# Patient Record
Sex: Female | Born: 1985
Health system: Southern US, Community
[De-identification: ages and names within clinical notes are randomized; demographics above are authoritative.]

## PROBLEM LIST (undated history)

## (undated) DIAGNOSIS — D649 Anemia, unspecified: Secondary | ICD-10-CM

## (undated) DIAGNOSIS — T7840XA Allergy, unspecified, initial encounter: Secondary | ICD-10-CM

## (undated) DIAGNOSIS — F419 Anxiety disorder, unspecified: Secondary | ICD-10-CM

## (undated) DIAGNOSIS — R519 Headache, unspecified: Secondary | ICD-10-CM

## (undated) DIAGNOSIS — T753XXA Motion sickness, initial encounter: Secondary | ICD-10-CM

## (undated) DIAGNOSIS — F32A Depression, unspecified: Secondary | ICD-10-CM

## (undated) HISTORY — DX: Anxiety disorder, unspecified: F41.9

## (undated) HISTORY — DX: Depression, unspecified: F32.A

## (undated) HISTORY — DX: Anemia, unspecified: D64.9

## (undated) HISTORY — DX: Allergy, unspecified, initial encounter: T78.40XA

---

## 2005-09-24 ENCOUNTER — Observation Stay: Payer: Self-pay

## 2005-09-24 ENCOUNTER — Other Ambulatory Visit: Payer: Self-pay

## 2005-12-03 ENCOUNTER — Observation Stay: Payer: Self-pay | Admitting: Obstetrics and Gynecology

## 2005-12-13 ENCOUNTER — Observation Stay: Payer: Self-pay | Admitting: Obstetrics and Gynecology

## 2005-12-13 ENCOUNTER — Inpatient Hospital Stay: Payer: Self-pay

## 2009-08-09 ENCOUNTER — Inpatient Hospital Stay: Payer: Self-pay

## 2010-03-29 ENCOUNTER — Ambulatory Visit: Payer: Self-pay | Admitting: Obstetrics and Gynecology

## 2010-04-05 ENCOUNTER — Ambulatory Visit: Payer: Self-pay | Admitting: Obstetrics and Gynecology

## 2010-04-13 ENCOUNTER — Ambulatory Visit: Payer: Self-pay | Admitting: Obstetrics and Gynecology

## 2010-04-15 HISTORY — PX: LAPAROSCOPY: SHX197

## 2012-01-10 IMAGING — CR PELVIS - 1-2 VIEW
1 series · 1 of 1 positions shown · non-contrast
Comparison: none

REASON FOR EXAM: plain films requested. unable to locate IUD on US
COMMENTS:

PROCEDURE:     DXR - DXR PELVIS AP ONLY  - March 29, 2010 [DATE]
RESULT:     A T-shaped intrauterine device projects within the upper left
paracentral portion of the pelvis at the level of the lower to mid sacrum.
The osseous structures demonstrate no evidence of fracture or dislocation.

[view not recorded]
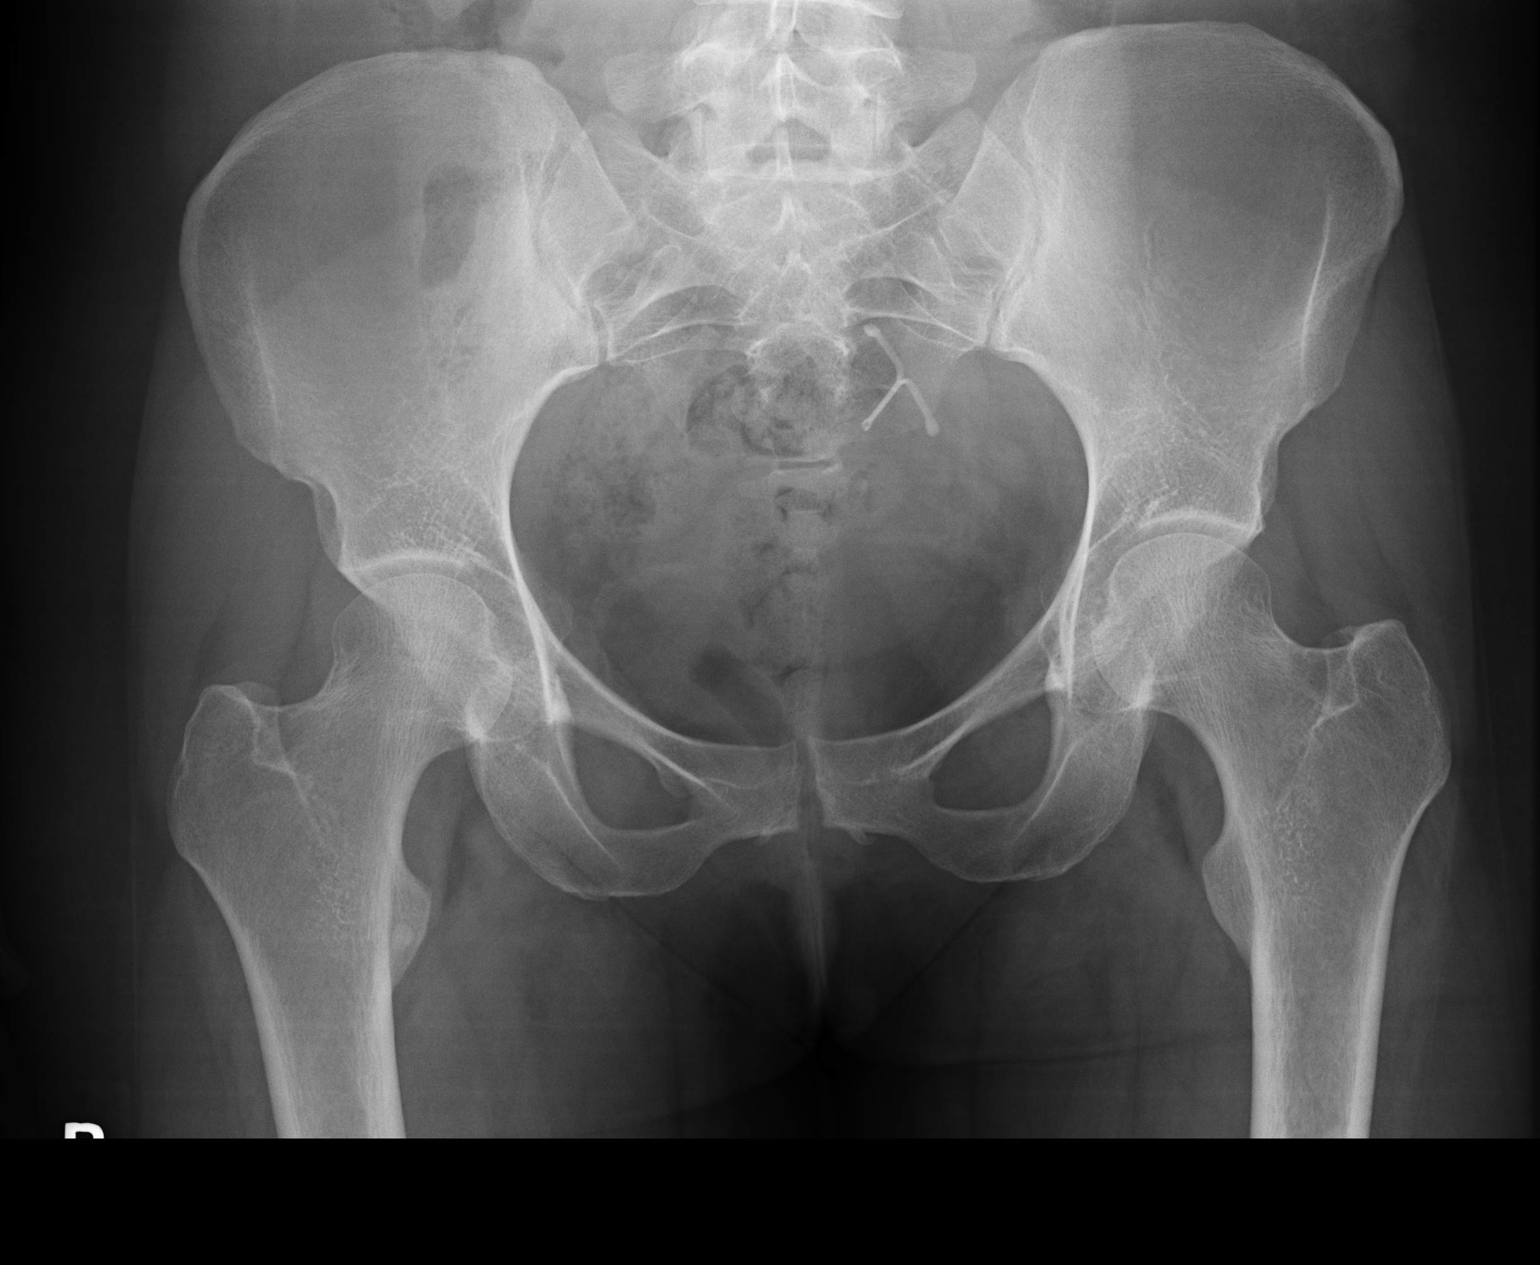

[1 of 1 positions shown; findings below may reference images not displayed]

IMPRESSION: T-shaped intrauterine device projecting at the level of the mid to lower
sacrum on the left as described above.

## 2012-09-21 ENCOUNTER — Inpatient Hospital Stay: Payer: Self-pay

## 2012-09-21 LAB — CBC WITH DIFFERENTIAL/PLATELET
Basophil %: 0.3 %
Eosinophil #: 0.1 10*3/uL (ref 0.0–0.7)
Eosinophil %: 0.6 %
HCT: 30.8 % — ABNORMAL LOW (ref 35.0–47.0)
Lymphocyte #: 1.9 10*3/uL (ref 1.0–3.6)
MCHC: 33.2 g/dL (ref 32.0–36.0)
MCV: 76 fL — ABNORMAL LOW (ref 80–100)
Monocyte #: 0.6 x10 3/mm (ref 0.2–0.9)
Neutrophil %: 69.6 %
Platelet: 301 10*3/uL (ref 150–440)
RBC: 4.04 10*6/uL (ref 3.80–5.20)
WBC: 8.6 10*3/uL (ref 3.6–11.0)

## 2012-09-22 LAB — HEMATOCRIT: HCT: 27.7 % — ABNORMAL LOW (ref 35.0–47.0)

## 2013-06-25 LAB — HM PAP SMEAR: HM Pap smear: NEGATIVE

## 2014-02-08 ENCOUNTER — Ambulatory Visit: Payer: Self-pay | Admitting: Family Medicine

## 2014-08-24 ENCOUNTER — Emergency Department: Payer: Self-pay | Admitting: Emergency Medicine

## 2014-11-23 NOTE — H&P (Signed)
L&D Evaluation:  History:  HPI 29 yo WF presents for IOL due to h/o previous LGA infant, cervix 3/50/-2, normal pregnancy to date.   Patient's Medical History depression-on zoloft 50mg  daily   Patient's Surgical History none   Medications Pre Natal Vitamins  zoloft 50mg    Allergies PCN   Social History none   Family History Non-Contributory   ROS:  ROS All systems were reviewed.  HEENT, CNS, GI, GU, Respiratory, CV, Renal and Musculoskeletal systems were found to be normal.   Exam:  Vital Signs stable   Urine Protein not completed   General no apparent distress   Mental Status clear   Chest clear   Heart normal sinus rhythm   Abdomen gravid, non-tender   Estimated Fetal Weight Average for gestational age   Fetal Position vertex   Fundal Height 40   Edema no edema   Pelvic 3/50/-2   Mebranes Intact   FHT normal rate with no decels   Fetal Heart Rate 154   Ucx irregular   Ucx Frequency 6 min   Length of each Contraction 60 seconds   Ucx Pain Scale 1   Skin dry   Impression:  Impression IOL   Plan:  Plan pitocin IOL   Electronic Signatures: Ulyses AmorBurr, Melody N (CNM)  (Signed 09-Mar-14 14:58)  Authored: L&D Evaluation   Last Updated: 09-Mar-14 14:58 by Ulyses AmorBurr, Melody N (CNM)

## 2015-06-30 ENCOUNTER — Encounter: Payer: Self-pay | Admitting: *Deleted

## 2015-07-05 ENCOUNTER — Ambulatory Visit (INDEPENDENT_AMBULATORY_CARE_PROVIDER_SITE_OTHER): Payer: Federal, State, Local not specified - PPO | Admitting: Obstetrics and Gynecology

## 2015-07-05 ENCOUNTER — Encounter: Payer: Self-pay | Admitting: Obstetrics and Gynecology

## 2015-07-05 VITALS — BP 107/77 | HR 107 | Ht 66.0 in | Wt 192.7 lb

## 2015-07-05 DIAGNOSIS — E669 Obesity, unspecified: Secondary | ICD-10-CM | POA: Diagnosis not present

## 2015-07-05 DIAGNOSIS — Z01419 Encounter for gynecological examination (general) (routine) without abnormal findings: Secondary | ICD-10-CM

## 2015-07-05 MED ORDER — INFLUENZA VAC SPLIT QUAD 0.5 ML IM SUSY
0.5000 mL | PREFILLED_SYRINGE | Freq: Once | INTRAMUSCULAR | Status: AC
  Administered 2015-07-05: 0.5 mL via INTRAMUSCULAR

## 2015-07-05 MED ORDER — ETONOGESTREL-ETHINYL ESTRADIOL 0.12-0.015 MG/24HR VA RING: 1.0000 | VAGINAL_RING | VAGINAL | Status: DC

## 2015-07-05 MED ORDER — SERTRALINE HCL 50 MG PO TABS: 50.0000 mg | ORAL_TABLET | Freq: Two times a day (BID) | ORAL | Status: DC

## 2015-07-05 NOTE — Progress Notes (Signed)
Subjective:   Lura Emshton A Aldrete is a 29 y.o. 883P0 Caucasian female here for a routine well-woman exam.  Patient's last menstrual period was 06/10/2015 (exact date).    Current complaints: weight gain PCP: me       does desire labs  Social History: Sexual: heterosexual Marital Status: married Living situation: with family Occupation: self-employed Tobacco/alcohol: no tobacco use Illicit drugs: no history of illicit drug use  The following portions of the patient's history were reviewed and updated as appropriate: allergies, current medications, past family history, past medical history, past social history, past surgical history and problem list.  Past Medical History Past Medical History  Diagnosis Date  . Anxiety   . Anemia     Past Surgical History Past Surgical History  Procedure Laterality Date  . Laparoscopy  04/2010    lost IUD    Gynecologic History G3P0  Patient's last menstrual period was 06/10/2015 (exact date). Contraception: NuvaRing vaginal inserts Last Pap: 2015. Results were: normal  Obstetric History OB History  Gravida Para Term Preterm AB SAB TAB Ectopic Multiple Living  3         3    # Outcome Date GA Lbr Len/2nd Weight Sex Delivery Anes PTL Lv  3 Gravida 2014    F Vag-Spont   Y  2 Gravida 2011    F Vag-Spont   Y  1 Gravida 2007    M Vag-Spont   Y      Current Medications No current outpatient prescriptions on file prior to visit.   No current facility-administered medications on file prior to visit.    Review of Systems Patient denies any headaches, blurred vision, shortness of breath, chest pain, abdominal pain, problems with bowel movements, urination, or intercourse.  Objective:  BP 107/77 mmHg  Pulse 107  Ht 5\' 6"  (1.676 m)  Wt 192 lb 11.2 oz (87.408 kg)  BMI 31.12 kg/m2  LMP 06/10/2015 (Exact Date) Physical Exam  General:  Well developed, well nourished, no acute distress. She is alert and oriented x3. Skin:  Warm and  dry Neck:  Midline trachea, no thyromegaly or nodules Cardiovascular: Regular rate and rhythm, no murmur heard Lungs:  Effort normal, all lung fields clear to auscultation bilaterally Breasts:  No dominant palpable mass, retraction, or nipple discharge Abdomen:  Soft, non tender, no hepatosplenomegaly or masses Pelvic:  External genitalia is normal in appearance.  The vagina is normal in appearance. The cervix is bulbous, no CMT.  Thin prep pap is not doneUterus is felt to be normal size, shape, and contour.  No adnexal masses or tenderness noted. Extremities:  No swelling or varicosities noted Psych:  She has a normal mood and affect  Assessment:   Healthy well-woman exam Obesity Nuvaring user Fle vaccine needed  Plan:  routine screening labs obtained, Flu vaccine given F/U 1 year for AE, or sooner if needed RTC this week to restart weight loss program  Doral Digangi Suzan NailerN Yetzali Weld, CNM

## 2015-07-05 NOTE — Patient Instructions (Signed)
  Place annual gynecologic exam patient instructions here.  Thank you for enrolling in MyChart. Please follow the instructions below to securely access your online medical record. MyChart allows you to send messages to your doctor, view your test results, manage appointments, and more.   How Do I Sign Up? 1. In your Internet browser, go to [employer] Address Bar and enter [email or web address]. 2. Click on the Sign Up Now link in the Sign In box. You will see the New Member Sign Up page. 3. Enter your MyChart Access Code exactly as it appears below. You will not need to use this code after you've completed the sign-up process. If you do not sign up before the expiration date, you must request a new code.  MyChart Access Code: [email or web address] Expires: 09/03/2015  9:04 AM  4. Enter your Social Security Number ([id number]) and Date of Birth (mm/dd/yyyy) as indicated and click Submit. You will be taken to the next sign-up page. 5. Create a MyChart ID. This will be your MyChart login ID and cannot be changed, so think of one that is secure and easy to remember. 6. Create a MyChart password. You can change your password at any time. 7. Enter your Password Reset Question and Answer. This can be used at a later time if you forget your password.  8. Enter your e-mail address. You will receive e-mail notification when new information is available in MyChart. 9. Click Sign Up. You can now view your medical record.   Additional Information Remember, MyChart is NOT to be used for urgent needs. For medical emergencies, dial 911.

## 2015-07-05 NOTE — Addendum Note (Signed)
Addended by: Rosine BeatLONTZ, AMY L on: 07/05/2015 09:41 AM   Modules accepted: Orders

## 2015-07-06 ENCOUNTER — Other Ambulatory Visit: Payer: Self-pay | Admitting: Obstetrics and Gynecology

## 2015-07-06 DIAGNOSIS — E782 Mixed hyperlipidemia: Secondary | ICD-10-CM | POA: Insufficient documentation

## 2015-07-06 LAB — COMPREHENSIVE METABOLIC PANEL
ALT: 11 IU/L (ref 0–32)
AST: 13 IU/L (ref 0–40)
Albumin/Globulin Ratio: 1.6 (ref 1.1–2.5)
Albumin: 4.4 g/dL (ref 3.5–5.5)
Alkaline Phosphatase: 66 IU/L (ref 39–117)
BUN/Creatinine Ratio: 14 (ref 8–20)
BUN: 11 mg/dL (ref 6–20)
Bilirubin Total: 0.4 mg/dL (ref 0.0–1.2)
CO2: 21 mmol/L (ref 18–29)
CREATININE: 0.76 mg/dL (ref 0.57–1.00)
Calcium: 9.8 mg/dL (ref 8.7–10.2)
Chloride: 103 mmol/L (ref 96–106)
GFR calc non Af Amer: 106 mL/min/{1.73_m2} (ref >59)
GFR, EST AFRICAN AMERICAN: 122 mL/min/{1.73_m2} (ref >59)
GLUCOSE: 90 mg/dL (ref 65–99)
Globulin, Total: 2.8 g/dL (ref 1.5–4.5)
POTASSIUM: 4.3 mmol/L (ref 3.5–5.2)
Sodium: 138 mmol/L (ref 134–144)
Total Protein: 7.2 g/dL (ref 6.0–8.5)

## 2015-07-06 LAB — LIPID PANEL
CHOL/HDL RATIO: 4.8 ratio — AB (ref 0.0–4.4)
Cholesterol, Total: 243 mg/dL — ABNORMAL HIGH (ref 100–199)
HDL: 51 mg/dL (ref >39)
LDL CALC: 130 mg/dL — AB (ref 0–99)
Triglycerides: 311 mg/dL — ABNORMAL HIGH (ref 0–149)
VLDL Cholesterol Cal: 62 mg/dL — ABNORMAL HIGH (ref 5–40)

## 2015-07-25 ENCOUNTER — Telehealth: Payer: Self-pay | Admitting: *Deleted

## 2015-07-25 NOTE — Telephone Encounter (Signed)
-----   Message from Purcell NailsMelody N Shambley, [place] sent at 07/06/2015  4:50 PM EST ----- Please let her know metabolic panel is good, cholesterol is not! She really needs to work hard on weight loss, low chol diet and regular exercise. I want to recheck her levels in 3 months and if they don't come down will recommend medications.  I will have order in computer.

## 2015-07-25 NOTE — Telephone Encounter (Signed)
Notified pt and mailed her copy of her lab

## 2015-09-21 ENCOUNTER — Ambulatory Visit (INDEPENDENT_AMBULATORY_CARE_PROVIDER_SITE_OTHER): Payer: Federal, State, Local not specified - PPO | Admitting: Obstetrics and Gynecology

## 2015-09-21 VITALS — BP 125/91 | HR 105 | Wt 183.4 lb

## 2015-09-21 DIAGNOSIS — E663 Overweight: Secondary | ICD-10-CM

## 2015-09-21 NOTE — Progress Notes (Signed)
Patient ID: Michelle Gordon, female   DOB: 07/21/1985, 30 y.o.   MRN: [id number] Pt presents for weight, B/P, B-12 injection. No side effects of medication-Phentermine, or B-12.  Weight loss of 9 lbs. Encouraged eating healthy and exercise. Pt states she gives herself the B-12 injections.

## 2015-10-19 ENCOUNTER — Other Ambulatory Visit: Payer: Federal, State, Local not specified - PPO

## 2015-10-19 ENCOUNTER — Ambulatory Visit (INDEPENDENT_AMBULATORY_CARE_PROVIDER_SITE_OTHER): Payer: Federal, State, Local not specified - PPO | Admitting: Obstetrics and Gynecology

## 2015-10-19 VITALS — BP 121/82 | HR 97 | Wt 180.3 lb

## 2015-10-19 DIAGNOSIS — E782 Mixed hyperlipidemia: Secondary | ICD-10-CM

## 2015-10-19 DIAGNOSIS — E663 Overweight: Secondary | ICD-10-CM

## 2015-10-19 NOTE — Progress Notes (Signed)
Patient ID: Michelle Gordon, female   DOB: 03/15/1986, 30 y.o.   MRN: [id number] Pt presents for weight, B/P, B-12 injection (gives self B-12 injection). No side effects of medication-Phentermine, or B-12.  Weight loss of  3lbs. Encouraged eating healthy and exercise. Pt will schedule 2 wk f/u with [facility]NS for weight management f/u.

## 2015-10-20 LAB — NMR, LIPOPROFILE
CHOLESTEROL: 216 mg/dL — AB (ref 100–199)
HDL CHOLESTEROL BY NMR: 56 mg/dL (ref >39)
HDL PARTICLE NUMBER: 34.7 umol/L (ref >=30.5)
LDL Particle Number: 1686 nmol/L — ABNORMAL HIGH (ref <1000)
LDL Size: 21 nm (ref >20.5)
LDL-C: 124 mg/dL — AB (ref 0–99)
LP-IR Score: 66 — ABNORMAL HIGH (ref <=45)
SMALL LDL PARTICLE NUMBER: 697 nmol/L — AB (ref <=527)
TRIGLYCERIDES BY NMR: 180 mg/dL — AB (ref 0–149)

## 2015-10-24 ENCOUNTER — Telehealth: Payer: Self-pay | Admitting: *Deleted

## 2015-10-24 NOTE — Telephone Encounter (Signed)
Notified pt of results, mailed copy to pt

## 2015-10-24 NOTE — Telephone Encounter (Signed)
-----   Message from Purcell NailsMelody N Shambley, [place] sent at 10/22/2015  5:09 PM EDT ----- Please let her know lab looks better, but not great. Confirms hereditary component. That it will be important for her to always eat low cholesterol, have regular exercise. May still need to go one meds one day.

## 2016-05-23 ENCOUNTER — Encounter: Payer: Self-pay | Admitting: Obstetrics and Gynecology

## 2016-05-23 ENCOUNTER — Ambulatory Visit (INDEPENDENT_AMBULATORY_CARE_PROVIDER_SITE_OTHER): Payer: Federal, State, Local not specified - PPO | Admitting: Obstetrics and Gynecology

## 2016-05-23 VITALS — BP 114/84 | HR 86 | Ht 66.0 in | Wt 190.8 lb

## 2016-05-23 DIAGNOSIS — Z23 Encounter for immunization: Secondary | ICD-10-CM

## 2016-05-23 DIAGNOSIS — Z6830 Body mass index (BMI) 30.0-30.9, adult: Secondary | ICD-10-CM | POA: Diagnosis not present

## 2016-05-23 DIAGNOSIS — E6609 Other obesity due to excess calories: Secondary | ICD-10-CM | POA: Diagnosis not present

## 2016-05-23 MED ORDER — CYANOCOBALAMIN 1000 MCG/ML IJ SOLN: 1000.0000 ug | INTRAMUSCULAR | 1 refills | Status: DC

## 2016-05-23 MED ORDER — PHENTERMINE HCL 37.5 MG PO TABS: 37.5000 mg | ORAL_TABLET | Freq: Every day | ORAL | 2 refills | Status: DC

## 2016-05-23 NOTE — Progress Notes (Signed)
Subjective:  Michelle Gordon is a 30 y.o. G3P0 at Unknown being seen today for weight loss management- initial visit.  Patient reports General ROS: negative and reports previous weight loss attempts: Seen here in the Spring and did well on adipex and B12, desires restarting medications. Has joined [employer] and is doing a Bootcamp 3 x week. Has treadmill at home and plans to restart it.  The following portions of the patient's history were reviewed and updated as appropriate: allergies, current medications, past family history, past medical history, past social history, past surgical history and problem list.   Objective:   Vitals:   05/23/16 1128  BP: 114/84  Pulse: 86  Weight: 190 lb 12.8 oz (86.5 kg)  Height: 5\' 6"  (1.676 m)    General:  Alert, oriented and cooperative. Patient is in no acute distress.  :   :   :   :   :   :   PE: Well groomed female in no current distress,   Mental Status: Normal mood and affect. Normal behavior. Normal judgment and thought content.   Current BMI: Body mass index is 30.8 kg/m.   Assessment and Plan:  Obesity  1. Class 1 obesity due to excess calories without serious comorbidity with body mass index (BMI) of 30.0 to 30.9 in adult  2. Flu vaccine need - Flu Vaccine QUAD 36+ mos IM   Plan: low carb, High protein diet RX for adipex 37.5 mg daily and B12 1000mcg.ml monthly, to start now with first injection given at today's visit. Reviewed side-effects common to both medications and expected outcomes. Increase daily water intake to at least 8 bottle a day, every day.  Goal is to reduse weight by 10% by end of three months, and will re-evaluate then.  RTC in 4 weeks for Nurse visit to check weight & BP, and get next B12 injections.    Please refer to After Visit Summary for other counseling recommendations.    Melody N BurlingtonShambley, CNM   Melody NIKE Shambley, CNM      Consider the Low Glycemic Index Diet and 6 smaller  meals daily .  This boosts your metabolism and regulates your sugars:   Use the protein bar by Atkins because they have lots of fiber in them  Find the low carb flatbreads, tortillas and pita breads for sandwiches:  Joseph's makes a pita bread and a flat bread , available at [facility] [employer] and [employer]; Toufayah makes a low carb flatbread available at [employer] and HT that is 9 net carbs and 100 cal Mission makes a low carb whole wheat tortilla available at [employer],and most grocery stores with 6 net carbs and 210 cal  [place] yogurt can still have a lot of carbs .  Dannon Light N fit has 80 cal and 8 carbs

## 2016-08-09 ENCOUNTER — Other Ambulatory Visit: Payer: Self-pay | Admitting: Obstetrics and Gynecology

## 2016-12-26 ENCOUNTER — Ambulatory Visit (INDEPENDENT_AMBULATORY_CARE_PROVIDER_SITE_OTHER): Payer: Federal, State, Local not specified - PPO | Admitting: Obstetrics and Gynecology

## 2016-12-26 ENCOUNTER — Encounter: Payer: Self-pay | Admitting: Obstetrics and Gynecology

## 2016-12-26 VITALS — BP 109/75 | HR 78 | Ht 66.0 in | Wt 187.4 lb

## 2016-12-26 DIAGNOSIS — Z79899 Other long term (current) drug therapy: Secondary | ICD-10-CM

## 2016-12-26 MED ORDER — SERTRALINE HCL 50 MG PO TABS: 50.0000 mg | ORAL_TABLET | Freq: Two times a day (BID) | ORAL | 2 refills | Status: DC

## 2016-12-26 NOTE — Progress Notes (Signed)
Subjective:     Patient ID: Lura EmAshton A Garlitz, female   DOB: 04/28/1986, 31 y.o.   MRN: [id number]  HPI  Here to refill zoloft, stopped it 3 weeks ago to see how she felt, and emotions went crazy, very tearful, and restarted 1 week ago. 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  Desires to stay on it for now.  Spouse just deployed again for 10 months. Feels really tired a lot and wants to sleep since stopping adipex and B12.  Also needs Nuvaring. AE not due till August.  Review of Systems Negative except stated above.    Objective:   Physical Exam A&O x4 Well groomed female, slightly tearful when talking about depression. Blood pressure 109/75, pulse 78, height 5\' 6"  (1.676 m), weight 187 lb 6.4 oz (85 kg), last menstrual period 11/29/2016. Body mass index is 30.25 kg/m.  PE not indicated     Assessment:     Medication management and refills.     Plan:     zoloft and Nuvaring refilled. B12 injection given today. RTC at scheduled annual.  Harlow MaresMelody Jayvan Mcshan, CNM

## 2017-03-15 ENCOUNTER — Encounter: Payer: Federal, State, Local not specified - PPO | Admitting: Obstetrics and Gynecology

## 2017-03-20 ENCOUNTER — Ambulatory Visit (INDEPENDENT_AMBULATORY_CARE_PROVIDER_SITE_OTHER): Payer: Federal, State, Local not specified - PPO | Admitting: Obstetrics and Gynecology

## 2017-03-20 ENCOUNTER — Encounter: Payer: Self-pay | Admitting: Obstetrics and Gynecology

## 2017-03-20 VITALS — BP 116/76 | HR 88 | Ht 66.0 in | Wt 190.7 lb

## 2017-03-20 DIAGNOSIS — E785 Hyperlipidemia, unspecified: Secondary | ICD-10-CM | POA: Diagnosis not present

## 2017-03-20 DIAGNOSIS — Z01419 Encounter for gynecological examination (general) (routine) without abnormal findings: Secondary | ICD-10-CM

## 2017-03-20 MED ORDER — ETONOGESTREL-ETHINYL ESTRADIOL 0.12-0.015 MG/24HR VA RING: VAGINAL_RING | VAGINAL | 11 refills | Status: DC

## 2017-03-20 MED ORDER — CYANOCOBALAMIN 1000 MCG/ML IJ SOLN: 1000.0000 ug | INTRAMUSCULAR | 1 refills | Status: DC

## 2017-03-20 MED ORDER — PHENTERMINE HCL 37.5 MG PO TABS: 37.5000 mg | ORAL_TABLET | Freq: Every day | ORAL | 2 refills | Status: DC

## 2017-03-20 MED ORDER — SERTRALINE HCL 50 MG PO TABS: 50.0000 mg | ORAL_TABLET | Freq: Two times a day (BID) | ORAL | 11 refills | Status: DC

## 2017-03-20 NOTE — Patient Instructions (Signed)
Thank you for enrolling in MyChart. Please follow the instructions below to securely access your online medical record. MyChart allows you to send messages to your doctor, view your test results, renew your prescriptions, schedule appointments, and more.  How Do I Sign Up? 1. In your Internet browser, go to [email or web address]. 2. Click on the New  User? link in the Sign In box.  3. Enter your MyChart Access Code exactly as it appears below. You will not need to use this code after you have completed the sign-up process. If you do not sign up before the expiration date, you must request a new code. MyChart Access Code: [email or web address] Expires: 05/19/2017  4:48 PM  4. Enter the last four digits of your Social Security Number (xxxx) and Date of Birth (mm/dd/yyyy) as indicated and click Next. You will be taken to the next sign-up page. 5. Create a MyChart ID. This will be your MyChart login ID and cannot be changed, so think of one that is secure and easy to remember. 6. Create a MyChart password. You can change your password at any time. 7. Enter your Password Reset Question and Answer and click Next. This can be used at a later time if you forget your password.  8. Select your communication preference, and if applicable enter your e-mail address. You will receive e-mail notification when new information is available in MyChart by choosing to receive e-mail notifications and filling in your e-mail. 9. Click Sign In. You can now view your medical record.   Additional Information If you have questions, you can email [email or web address] or call [phone] to talk to our MyChart staff. Remember, MyChart is NOT to be used for urgent needs. For medical emergencies, dial 911.

## 2017-03-20 NOTE — Progress Notes (Signed)
Subjective:   Michelle Gordon is a 31 y.o. 283P0 Caucasian female here for a routine well-woman exam.  Patient's last menstrual period was 03/11/2017.    Current complaints: desires restart weight loss meds. PCP: ME       DOES desire labs  Social History: Sexual: heterosexual Marital Status: married Living situation: with family Occupation: self-employed Tobacco/alcohol: no tobacco use Illicit drugs: no history of illicit drug use  The following portions of the patient's history were reviewed and updated as appropriate: allergies, current medications, past family history, past medical history, past social history, past surgical history and problem list.  Past Medical History Past Medical History:  Diagnosis Date  . Anemia   . Anxiety     Past Surgical History Past Surgical History:  Procedure Laterality Date  . LAPAROSCOPY  04/2010   lost IUD    Gynecologic History G3P0  Patient's last menstrual period was 03/11/2017. Contraception: NuvaRing vaginal inserts Last Pap: 2016. Results were: normal   Obstetric History OB History  Gravida Para Term Preterm AB Living  3         3  SAB TAB Ectopic Multiple Live Births          3    # Outcome Date GA Lbr Len/2nd Weight Sex Delivery Anes PTL Lv  3 Gravida 2014    F Vag-Spont   LIV  2 Gravida 2011    F Vag-Spont   LIV  1 Gravida 2007    M Vag-Spont   LIV      Current Medications Current Outpatient Prescriptions on File Prior to Visit  Medication Sig Dispense Refill  . NUVARING 0.12-0.015 MG/24HR vaginal ring insert 1 ring vaginally for 3 weeks REMOVE for 1 week 1 each 11  . sertraline (ZOLOFT) 50 MG tablet Take 1 tablet (50 mg total) by mouth 2 (two) times daily. 60 tablet 2  . cyanocobalamin (,VITAMIN B-12,) 1000 MCG/ML injection Inject 1 mL (1,000 mcg total) into the muscle every 30 (thirty) days. (Patient not taking: Reported on 12/26/2016) 10 mL 1  . phentermine (ADIPEX-P) 37.5 MG tablet Take 1 tablet (37.5 mg  total) by mouth daily before breakfast. (Patient not taking: Reported on 12/26/2016) 30 tablet 2   No current facility-administered medications on file prior to visit.     Review of Systems Patient denies any headaches, blurred vision, shortness of breath, chest pain, abdominal pain, problems with bowel movements, urination, or intercourse.  Objective:  BP 116/76   Pulse 88   Ht 5\' 6"  (1.676 m)   Wt 190 lb 11.2 oz (86.5 kg)   LMP 03/11/2017   BMI 30.78 kg/m  Physical Exam  General:  Well developed, well nourished, no acute distress. She is alert and oriented x3. Skin:  Warm and dry Neck:  Midline trachea, no thyromegaly or nodules Cardiovascular: Regular rate and rhythm, no murmur heard Lungs:  Effort normal, all lung fields clear to auscultation bilaterally Breasts:  No dominant palpable mass, retraction, or nipple discharge Abdomen:  Soft, non tender, no hepatosplenomegaly or masses Pelvic:  External genitalia is normal in appearance.  The vagina is normal in appearance. The cervix is bulbous, no CMT.  Thin prep pap is not done. Uterus is felt to be normal size, shape, and contour.  No adnexal masses or tenderness noted. Extremities:  No swelling or varicosities noted Psych:  She has a normal mood and affect  Assessment:   Healthy well-woman exam Elevated cholesterol Weight loss  Plan:  Labs ordered  will follow up accordingly F/U 1 year for AE, or sooner if needed B12 given today, will follow up in 1 month  Braeleigh Pyper Suzan Nailer, CNM

## 2017-03-21 ENCOUNTER — Other Ambulatory Visit: Payer: Federal, State, Local not specified - PPO

## 2017-03-22 LAB — THYROID PANEL WITH TSH
Free Thyroxine Index: 1.3 (ref 1.2–4.9)
T3 Uptake Ratio: 23 % — ABNORMAL LOW (ref 24–39)
T4, Total: 5.8 ug/dL (ref 4.5–12.0)
TSH: 1.54 u[IU]/mL (ref 0.450–4.500)

## 2017-03-22 LAB — NMR, LIPOPROFILE
CHOLESTEROL: 238 mg/dL — AB (ref 100–199)
HDL CHOLESTEROL BY NMR: 51 mg/dL (ref >39)
HDL PARTICLE NUMBER: 33.1 umol/L (ref >=30.5)
LDL PARTICLE NUMBER: 2060 nmol/L — AB (ref <1000)
LDL Size: 21.3 nm (ref >20.5)
LDL-C: 149 mg/dL — AB (ref 0–99)
LP-IR SCORE: 75 — AB (ref <=45)
Small LDL Particle Number: 627 nmol/L — ABNORMAL HIGH (ref <=527)
Triglycerides by NMR: 189 mg/dL — ABNORMAL HIGH (ref 0–149)

## 2017-03-22 LAB — COMPREHENSIVE METABOLIC PANEL
A/G RATIO: 1.5 (ref 1.2–2.2)
ALBUMIN: 4.3 g/dL (ref 3.5–5.5)
ALT: 18 IU/L (ref 0–32)
AST: 21 IU/L (ref 0–40)
Alkaline Phosphatase: 74 IU/L (ref 39–117)
BILIRUBIN TOTAL: 0.3 mg/dL (ref 0.0–1.2)
BUN / CREAT RATIO: 11 (ref 9–23)
BUN: 8 mg/dL (ref 6–20)
CHLORIDE: 103 mmol/L (ref 96–106)
CO2: 24 mmol/L (ref 20–29)
Calcium: 9.7 mg/dL (ref 8.7–10.2)
Creatinine, Ser: 0.72 mg/dL (ref 0.57–1.00)
GFR calc non Af Amer: 112 mL/min/{1.73_m2} (ref >59)
GFR, EST AFRICAN AMERICAN: 129 mL/min/{1.73_m2} (ref >59)
GLOBULIN, TOTAL: 2.9 g/dL (ref 1.5–4.5)
Glucose: 76 mg/dL (ref 65–99)
Potassium: 4.6 mmol/L (ref 3.5–5.2)
SODIUM: 143 mmol/L (ref 134–144)
TOTAL PROTEIN: 7.2 g/dL (ref 6.0–8.5)

## 2017-03-22 LAB — HEMOGLOBIN A1C
Est. average glucose Bld gHb Est-mCnc: 105 mg/dL
Hgb A1c MFr Bld: 5.3 % (ref 4.8–5.6)

## 2017-04-26 ENCOUNTER — Emergency Department
Admission: EM | Admit: 2017-04-26 | Discharge: 2017-04-26 | Disposition: A | Discharge status: Home / Self Care | Payer: Federal, State, Local not specified - PPO | Attending: Emergency Medicine | Admitting: Emergency Medicine

## 2017-04-26 DIAGNOSIS — Z87891 Personal history of nicotine dependence: Secondary | ICD-10-CM | POA: Diagnosis not present

## 2017-04-26 DIAGNOSIS — R112 Nausea with vomiting, unspecified: Secondary | ICD-10-CM | POA: Insufficient documentation

## 2017-04-26 DIAGNOSIS — Z79899 Other long term (current) drug therapy: Secondary | ICD-10-CM | POA: Diagnosis not present

## 2017-04-26 DIAGNOSIS — R197 Diarrhea, unspecified: Secondary | ICD-10-CM | POA: Insufficient documentation

## 2017-04-26 LAB — LIPASE, BLOOD: LIPASE: 19 U/L (ref 11–51)

## 2017-04-26 LAB — URINALYSIS, COMPLETE (UACMP) WITH MICROSCOPIC
BILIRUBIN URINE: NEGATIVE
Bacteria, UA: NONE SEEN
GLUCOSE, UA: NEGATIVE mg/dL
Hgb urine dipstick: NEGATIVE
KETONES UR: 5 mg/dL — AB
LEUKOCYTES UA: NEGATIVE
Nitrite: NEGATIVE
PH: 5 (ref 5.0–8.0)
Protein, ur: 30 mg/dL — AB
SPECIFIC GRAVITY, URINE: 1.032 — AB (ref 1.005–1.030)
SQUAMOUS EPITHELIAL / LPF: NONE SEEN

## 2017-04-26 LAB — COMPREHENSIVE METABOLIC PANEL
ALK PHOS: 65 U/L (ref 38–126)
ALT: 17 U/L (ref 14–54)
AST: 21 U/L (ref 15–41)
Albumin: 4.2 g/dL (ref 3.5–5.0)
Anion gap: 13 (ref 5–15)
BILIRUBIN TOTAL: 0.9 mg/dL (ref 0.3–1.2)
BUN: 17 mg/dL (ref 6–20)
CALCIUM: 9 mg/dL (ref 8.9–10.3)
CO2: 21 mmol/L — ABNORMAL LOW (ref 22–32)
CREATININE: 0.61 mg/dL (ref 0.44–1.00)
Chloride: 105 mmol/L (ref 101–111)
Glucose, Bld: 100 mg/dL — ABNORMAL HIGH (ref 65–99)
Potassium: 3.8 mmol/L (ref 3.5–5.1)
Sodium: 139 mmol/L (ref 135–145)
Total Protein: 7.9 g/dL (ref 6.5–8.1)

## 2017-04-26 LAB — CBC
HCT: 40.5 % (ref 35.0–47.0)
Hemoglobin: 14 g/dL (ref 12.0–16.0)
MCH: 29.3 pg (ref 26.0–34.0)
MCHC: 34.5 g/dL (ref 32.0–36.0)
MCV: 84.7 fL (ref 80.0–100.0)
PLATELETS: 355 10*3/uL (ref 150–440)
RBC: 4.78 MIL/uL (ref 3.80–5.20)
RDW: 13.2 % (ref 11.5–14.5)
WBC: 6.8 10*3/uL (ref 3.6–11.0)

## 2017-04-26 LAB — POC URINE PREG, ED: Preg Test, Ur: NEGATIVE

## 2017-04-26 MED ORDER — ONDANSETRON HCL 4 MG PO TABS: 4.0000 mg | ORAL_TABLET | Freq: Three times a day (TID) | ORAL | 0 refills | Status: DC | PRN

## 2017-04-26 MED ORDER — NAPROXEN 500 MG PO TABS: 500.0000 mg | ORAL_TABLET | Freq: Two times a day (BID) | ORAL | 0 refills | Status: DC

## 2017-04-26 MED ORDER — KETOROLAC TROMETHAMINE 30 MG/ML IJ SOLN
30.0000 mg | Freq: Once | INTRAMUSCULAR | Status: AC
  Administered 2017-04-26: 30 mg via INTRAVENOUS
  Filled 2017-04-26: qty 1

## 2017-04-26 MED ORDER — SODIUM CHLORIDE 0.9 % IV BOLUS (SEPSIS)
1000.0000 mL | Freq: Once | INTRAVENOUS | Status: AC
  Administered 2017-04-26: 1000 mL via INTRAVENOUS

## 2017-04-26 MED ORDER — ONDANSETRON HCL 4 MG/2ML IJ SOLN
4.0000 mg | Freq: Once | INTRAMUSCULAR | Status: AC
  Administered 2017-04-26: 4 mg via INTRAVENOUS
  Filled 2017-04-26: qty 2

## 2017-04-26 NOTE — Discharge Instructions (Signed)
Please seek medical attention for any high fevers, chest pain, shortness of breath, change in behavior, persistent vomiting, bloody stool or any other new or concerning symptoms.  

## 2017-04-26 NOTE — ED Notes (Signed)
Pt c/o generalized body aches, fatigue, NVD that began this AM. Denies blood in stool.

## 2017-04-26 NOTE — ED Notes (Signed)
PT reports she has not been able to give a urine sample still but reports she can not keep fluid down to have enough fluid to feel the need to urinate.

## 2017-04-26 NOTE — ED Notes (Signed)
PT able to ambulate at time of discharge without difficulty. Pt in NAD at this time.

## 2017-04-26 NOTE — ED Provider Notes (Signed)
[facility] Emergency Department Provider Note  ____________________________________________   I have reviewed the triage vital signs and the nursing notes.   HISTORY  Chief Complaint Abdominal Pain; Emesis; and Diarrhea   History limited by: Not Limited   HPI Michelle Gordon is a 31 y.o. female who presents to the emergency department today because of nausea, vomiting, diarrhea and body aches   LOCATION:body aches diffuse throughout her extremities and stomach. DURATION:started around midnight TIMING: started suddenly, woke patient from sleep SEVERITY: roughly 1 episode of diarrhea/vomiting per hour QUALITY: aches CONTEXT: denies any unusual ingestions. No recent travel. Maybe some sick contact MODIFYING FACTORS: nothing has made it better ASSOCIATED SYMPTOMS: headache  Per medical record review patient has a history of a laparoscopy  Past Medical History:  Diagnosis Date  . Anemia   . Anxiety     Patient Active Problem List   Diagnosis Date Noted  . Elevated cholesterol with elevated triglycerides 07/06/2015  . Obese 07/05/2015    Past Surgical History:  Procedure Laterality Date  . LAPAROSCOPY  04/2010   lost IUD    Prior to Admission medications   Medication Sig Start Date End Date Taking? Authorizing Provider  cyanocobalamin (,VITAMIN B-12,) 1000 MCG/ML injection Inject 1 mL (1,000 mcg total) into the muscle every 30 (thirty) days. 03/20/17   Purcell Nails, CNM  etonogestrel-ethinyl estradiol (NUVARING) 0.12-0.015 MG/24HR vaginal ring insert 1 ring vaginally for 3 weeks REMOVE for 1 week 03/20/17   Shambley, Melody N, CNM  phentermine (ADIPEX-P) 37.5 MG tablet Take 1 tablet (37.5 mg total) by mouth daily before breakfast. 03/20/17   Shambley, Melody N, CNM  sertraline (ZOLOFT) 50 MG tablet Take 1 tablet (50 mg total) by mouth 2 (two) times daily. 03/20/17   Shambley, Melody N, CNM    Allergies Penicillins  Family History   Problem Relation Age of Onset  . Hyperlipidemia Father   . Hypertension Father   . Hyperlipidemia Sister   . Hypertension Sister   . Cancer Maternal Grandmother        colon  . Heart disease Paternal Grandfather     Social History Social History  Substance Use Topics  . Smoking status: Former Games developer  . Smokeless tobacco: Never Used  . Alcohol use Yes     Comment: occas    Review of Systems Constitutional: Positive for chills. Eyes: No visual changes. ENT: No sore throat. Cardiovascular: Denies chest pain. Respiratory: Denies shortness of breath. Gastrointestinal: Positive for nausea, vomiting and diarrhea. Genitourinary: Negative for dysuria. Musculoskeletal: Positive for muscle aches. Skin: Negative for rash. Neurological: Positive for headache.   ____________________________________________   PHYSICAL EXAM:  VITAL SIGNS: ED Triage Vitals  Enc Vitals Group     BP 04/26/17 1717 104/79     Pulse Rate 04/26/17 1717 (!) 104     Resp 04/26/17 1717 16     Temp 04/26/17 1717 97.9 F (36.6 C)     Temp Source 04/26/17 1717 Oral     SpO2 04/26/17 1717 99 %     Weight 04/26/17 1718 183 lb (83 kg)     Height 04/26/17 1718  (1.676 m)     Head Circumference --      Peak Flow --      Pain Score 04/26/17 1717 5   Constitutional: Alert and oriented. Well appearing and in no distress. Eyes: Conjunctivae are normal.  ENT   Head: Normocephalic and atraumatic.   Nose: No congestion/rhinnorhea.   Mouth/Throat:  Mucous membranes are moist.   Neck: No stridor. Hematological/Lymphatic/Immunilogical: No cervical lymphadenopathy. Cardiovascular: Normal rate, regular rhythm.  No murmurs, rubs, or gallops.  Respiratory: Normal respiratory effort without tachypnea nor retractions. Breath sounds are clear and equal bilaterally. No wheezes/rales/rhonchi. Gastrointestinal: Soft and non tender. No rebound. No guarding.  Genitourinary: Deferred Musculoskeletal: Normal  range of motion in all extremities. No lower extremity edema. Neurologic:  Normal speech and language. No gross focal neurologic deficits are appreciated.  Skin:  Skin is warm, dry and intact. No rash noted. Psychiatric: Mood and affect are normal. Speech and behavior are normal. Patient exhibits appropriate insight and judgment.  ____________________________________________    LABS (pertinent positives/negatives)  CBC wnl Lipase 19 CMP wnl except for glucose 100, CO2 21 Preg negative UA not consistent with a UTI ____________________________________________   EKG  None  ____________________________________________    RADIOLOGY  None  ____________________________________________   PROCEDURES  Procedures  ____________________________________________   INITIAL IMPRESSION / ASSESSMENT AND PLAN / ED COURSE  Pertinent labs & imaging results that were available during my care of the patient were reviewed by me and considered in my medical decision making (see chart for details).  Patient presented with nausea and vomiting and diarrhea. ddx would include significant intraabdominal infection, gastroenteritis, gastritis, pancreatitis, hepatitis, food ingestion/toxicity amongst other etiologies. Blood work with normal WBC, lipase and essentially normal CMP. UA without evidence of infection. Patient felt better after fluids and medication. Was able to tolerate PO. Discussed with patient results of testing and plan to discharge with medication.  ____________________________________________   FINAL CLINICAL IMPRESSION(S) / ED DIAGNOSES  Final diagnoses:  Nausea vomiting and diarrhea     Note: This dictation was prepared with Dragon dictation. Any transcriptional errors that result from this process are unintentional     Phineas Semen, MD 04/26/17 2254

## 2017-04-26 NOTE — ED Triage Notes (Signed)
Pt c/o generalized abdominal pani, body aches, NVD that started this AM. Pt alert and oriented X4, active, cooperative, pt in NAD. RR even and unlabored, color WNL.

## 2017-08-30 ENCOUNTER — Encounter: Payer: Self-pay | Admitting: Obstetrics and Gynecology

## 2017-09-09 ENCOUNTER — Ambulatory Visit (INDEPENDENT_AMBULATORY_CARE_PROVIDER_SITE_OTHER): Payer: Federal, State, Local not specified - PPO | Admitting: Certified Nurse Midwife

## 2017-09-09 VITALS — BP 125/84 | HR 74 | Ht 66.0 in | Wt 179.2 lb

## 2017-09-09 DIAGNOSIS — N939 Abnormal uterine and vaginal bleeding, unspecified: Secondary | ICD-10-CM

## 2017-09-09 NOTE — Progress Notes (Signed)
Pt is here with c/o day 18 of period. This is the first time this has happened.Is using NuvaRing.

## 2017-09-09 NOTE — Patient Instructions (Signed)

## 2017-09-09 NOTE — Progress Notes (Signed)
GYN ENCOUNTER NOTE  Subjective:       Michelle Gordon is a 32 y.o. G76P0 female is here for gynecologic evaluation of the following issues:  1. Bleeding x 18 days. She states the bleeding slowed down 5 days ago. She is changing her tampon every 4 hrs. She denies any recent abdominal trama. She admits to having cramping and mood changes. She states that she is tearful at the end of the day but state her significant other just returned from deployment. She states she has been taking the Nuva ring correctly. Denies having any problem with bleeding in the past.    Gynecologic History Patient's last menstrual period was 08/24/2017 (exact date). Contraception: NuvaRing vaginal inserts Last Pap: 2014. Results were: normal Last mammogram: n/a.  Obstetric History OB History  Gravida Para Term Preterm AB Living  3         3  SAB TAB Ectopic Multiple Live Births          3    # Outcome Date GA Lbr Len/2nd Weight Sex Delivery Anes PTL Lv  3 Gravida 2014    F Vag-Spont   LIV  2 Gravida 2011    F Vag-Spont   LIV  1 Gravida 2007    M Vag-Spont   LIV      Past Medical History:  Diagnosis Date  . Anemia   . Anxiety     Past Surgical History:  Procedure Laterality Date  . LAPAROSCOPY  04/2010   lost IUD    Current Outpatient Medications on File Prior to Visit  Medication Sig Dispense Refill  . etonogestrel-ethinyl estradiol (NUVARING) 0.12-0.015 MG/24HR vaginal ring insert 1 ring vaginally for 3 weeks REMOVE for 1 week 1 each 11  . ondansetron (ZOFRAN) 4 MG tablet Take 1 tablet (4 mg total) by mouth every 8 (eight) hours as needed for nausea or vomiting. 20 tablet 0  . sertraline (ZOLOFT) 50 MG tablet Take 1 tablet (50 mg total) by mouth 2 (two) times daily. 60 tablet 11   No current facility-administered medications on file prior to visit.     Allergies  Allergen Reactions  . Penicillins     Social History   Socioeconomic History  . Marital status: Married    Spouse name:  Not on file  . Number of children: Not on file  . Years of education: Not on file  . Highest education level: Not on file  Social Needs  . Financial resource strain: Not on file  . Food insecurity - worry: Not on file  . Food insecurity - inability: Not on file  . Transportation needs - medical: Not on file  . Transportation needs - non-medical: Not on file  Occupational History  . Not on file  Tobacco Use  . Smoking status: Former Games developer  . Smokeless tobacco: Never Used  Substance and Sexual Activity  . Alcohol use: Yes    Comment: occas  . Drug use: No  . Sexual activity: Yes    Birth control/protection: Inserts  Other Topics Concern  . Not on file  Social History Narrative  . Not on file    Family History  Problem Relation Age of Onset  . Hyperlipidemia Father   . Hypertension Father   . Hyperlipidemia Sister   . Hypertension Sister   . Cancer Maternal Grandmother        colon  . Heart disease Paternal Grandfather     The following portions of the patient's history  were reviewed and updated as appropriate: allergies, current medications, past family history, past medical history, past social history, past surgical history and problem list.  Review of Systems Review of Systems - Negative except as mentioned in HPI Review of Systems - General ROS: negative for - chills, fatigue, fever, hot flashes, malaise or night sweats Hematological and Lymphatic ROS: negative for - bleeding problems or swollen lymph nodes Gastrointestinal ROS: negative for - abdominal pain, blood in stools, change in bowel habits and nausea/vomiting Musculoskeletal ROS: negative for - joint pain, muscle pain or muscular weakness Genito-Urinary ROS: negative for -, dysmenorrhea, dyspareunia, dysuria, genital discharge, genital ulcers, hematuria, incontinence,  nocturia or pelvic pain. Positive: irregular/heavy menses,  Objective:   BP 125/84   Pulse 74   Ht 5\' 6"  (1.676 m)   Wt 179 lb 4 oz  (81.3 kg)   LMP 08/24/2017 (Exact Date)   BMI 28.93 kg/m  CONSTITUTIONAL: Well-developed, well-nourished female in no acute distress.  HENT:  Normocephalic, atraumatic.  NECK: Normal range of motion, supple, SKIN: Skin is warm and dry. No rash noted. Not diaphoretic. No erythema. No pallor. NEUROLGIC: Alert and oriented to person, place, and time.  PSYCHIATRIC: Normal mood and affect. Normal behavior. Normal judgment and thought content. CARDIOVASCULAR:Not Examined RESPIRATORY: Not Examined BREASTS: Not Examined ABDOMEN: Soft, non distended; Non tender.  No Organomegaly. PELVIC: not indicated  MUSCULOSKELETAL: Normal range of motion. No tenderness.  No cyanosis, clubbing, or edema.   Assessment:   1. Abnormal uterine bleeding - US PELVIS (TRANSABDOMINAL ONLY); Future     Plan:   Sample of lo Loestrin given with instructions to take x 3  Months to help regulate her cycle. She denies any contraindications to pill . Ultrasound ordered to rule out fibroid/polyps. Follow up for u/s at earliest convince. Follow up as needed.   I attest more than 50% of visit spent discussing HPI, reviewing options for treatment and developing a plan of care. Face to face time 15 min.   Doreene BurkeAnnie Dafney Farler, CNM

## 2017-09-10 ENCOUNTER — Ambulatory Visit (INDEPENDENT_AMBULATORY_CARE_PROVIDER_SITE_OTHER): Payer: Federal, State, Local not specified - PPO

## 2017-09-10 DIAGNOSIS — N939 Abnormal uterine and vaginal bleeding, unspecified: Secondary | ICD-10-CM

## 2017-09-11 ENCOUNTER — Encounter: Payer: Self-pay | Admitting: Certified Nurse Midwife

## 2017-09-27 ENCOUNTER — Encounter: Payer: Self-pay | Admitting: Obstetrics and Gynecology

## 2017-09-27 ENCOUNTER — Ambulatory Visit (INDEPENDENT_AMBULATORY_CARE_PROVIDER_SITE_OTHER): Payer: Federal, State, Local not specified - PPO | Admitting: Obstetrics and Gynecology

## 2017-09-27 VITALS — BP 124/84 | HR 91 | Ht 66.0 in | Wt 183.3 lb

## 2017-09-27 DIAGNOSIS — E663 Overweight: Secondary | ICD-10-CM

## 2017-09-27 DIAGNOSIS — Z79899 Other long term (current) drug therapy: Secondary | ICD-10-CM | POA: Diagnosis not present

## 2017-09-27 MED ORDER — CYANOCOBALAMIN 1000 MCG/ML IJ SOLN: 1000.0000 ug | INTRAMUSCULAR | 1 refills | Status: DC

## 2017-09-27 NOTE — Progress Notes (Signed)
Subjective:     Patient ID: Michelle Gordon, adult   DOB: 11/25/1985, 32 y.o.   MRN: [id number]  HPI Here to discuss weight loss program again. Is exercising (cycling) daily and doing fit program.  Reports more mood swing last month, questioning changing SSRI or going up on dose.   Also reports last month menses lasting 18 days (with Nuvaring use) had u/s here in office, and told saw PCOS appearance, was given OCPs to take in addition to Nuvaring. Does report 15# weight loss at same time.    Review of Systems Negative except stated above.    Objective:   Physical Exam A&Ox4 Well groomed female in no distress Blood pressure 124/84, pulse 91, height 5\' 6"  (1.676 m), weight 183 lb 4.8 oz (83.1 kg), last menstrual period 09/26/2017. Body mass index is 29.59 kg/m.  PE not indicated    Assessment:     Overweight Contraception management    Plan:     Restart adipex & B12 injection. Continue exercise Will continue Nuvaring for now, but if BTB occurs again will switch back to pills.  Also will continue zoloft 50mg  bid for now.  RTC 4 weeks for wt/bp/B12   Harlow MaresMelody Anothony Bursch, CNM

## 2017-10-18 ENCOUNTER — Encounter: Payer: Self-pay | Admitting: Obstetrics and Gynecology

## 2018-02-26 DIAGNOSIS — S71112A Laceration without foreign body, left thigh, initial encounter: Secondary | ICD-10-CM | POA: Diagnosis not present

## 2018-03-23 ENCOUNTER — Other Ambulatory Visit: Payer: Self-pay | Admitting: Obstetrics and Gynecology

## 2018-04-06 ENCOUNTER — Other Ambulatory Visit: Payer: Self-pay | Admitting: Obstetrics and Gynecology

## 2018-04-23 ENCOUNTER — Other Ambulatory Visit (HOSPITAL_COMMUNITY)
Admission: RE | Admit: 2018-04-23 | Discharge: 2018-04-23 | Disposition: A | Discharge status: Home / Self Care | Payer: Federal, State, Local not specified - PPO | Source: Ambulatory Visit | Attending: Obstetrics and Gynecology | Admitting: Obstetrics and Gynecology

## 2018-04-23 ENCOUNTER — Encounter: Payer: Self-pay | Admitting: Obstetrics and Gynecology

## 2018-04-23 ENCOUNTER — Ambulatory Visit (INDEPENDENT_AMBULATORY_CARE_PROVIDER_SITE_OTHER): Payer: Federal, State, Local not specified - PPO | Admitting: Obstetrics and Gynecology

## 2018-04-23 VITALS — BP 101/69 | HR 77 | Ht 66.0 in | Wt 192.0 lb

## 2018-04-23 DIAGNOSIS — E785 Hyperlipidemia, unspecified: Secondary | ICD-10-CM

## 2018-04-23 DIAGNOSIS — Z6830 Body mass index (BMI) 30.0-30.9, adult: Secondary | ICD-10-CM

## 2018-04-23 DIAGNOSIS — Z23 Encounter for immunization: Secondary | ICD-10-CM | POA: Diagnosis not present

## 2018-04-23 DIAGNOSIS — E6609 Other obesity due to excess calories: Secondary | ICD-10-CM | POA: Diagnosis not present

## 2018-04-23 DIAGNOSIS — Z01419 Encounter for gynecological examination (general) (routine) without abnormal findings: Secondary | ICD-10-CM

## 2018-04-23 DIAGNOSIS — Z01411 Encounter for gynecological examination (general) (routine) with abnormal findings: Secondary | ICD-10-CM

## 2018-04-23 MED ORDER — ETONOGESTREL-ETHINYL ESTRADIOL 0.12-0.015 MG/24HR VA RING: VAGINAL_RING | VAGINAL | 11 refills | Status: DC

## 2018-04-23 MED ORDER — PHENTERMINE HCL 37.5 MG PO TABS: 37.5000 mg | ORAL_TABLET | Freq: Every day | ORAL | 2 refills | Status: DC

## 2018-04-23 MED ORDER — IBUPROFEN 800 MG PO TABS: 800.0000 mg | ORAL_TABLET | Freq: Three times a day (TID) | ORAL | 1 refills | Status: DC | PRN

## 2018-04-23 MED ORDER — SUMATRIPTAN SUCCINATE 25 MG PO TABS: 25.0000 mg | ORAL_TABLET | ORAL | 0 refills | Status: DC | PRN

## 2018-04-23 NOTE — Progress Notes (Signed)
Subjective:   Michelle Gordon is a 32 y.o. G3P0 Caucasian adult here for a routine well-woman exam.  Patient's last menstrual period was 04/09/2018.    Current complaints: regained weight but back on [employer] for 100 days to lose weight.  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  Past Medical History Past Medical History:  Diagnosis Date  . Anemia   . Anxiety     Past Surgical History Past Surgical History:  Procedure Laterality Date  . LAPAROSCOPY  04/2010   lost IUD    Gynecologic History G3P0  Patient's last menstrual period was 04/09/2018. Contraception: OCP (estrogen/progesterone) Last Pap: 2014. Results were: normal L Obstetric History OB History  Gravida Para Term Preterm AB Living  3         3  SAB TAB Ectopic Multiple Live Births          3    # Outcome Date GA Lbr Len/2nd Weight Sex Delivery Anes PTL Lv  3 Gravida 2014    F Vag-Spont   LIV  2 Gravida 2011    F Vag-Spont   LIV  1 Gravida 2007    M Vag-Spont   LIV    Current Medications Current Outpatient Medications on File Prior to Visit  Medication Sig Dispense Refill  . etonogestrel-ethinyl estradiol (NUVARING) 0.12-0.015 MG/24HR vaginal ring INSERT 1 RING IN THE VAGINA FOR 3 WEEKS, THEN REMOVE FOR 1 WEEK. 1 each 0  . sertraline (ZOLOFT) 50 MG tablet TAKE 1 TABLET BY MOUTH TWICE DAILY 60 tablet 2  . cyanocobalamin (,VITAMIN B-12,) 1000 MCG/ML injection Inject 1 mL (1,000 mcg total) into the muscle every 30 (thirty) days. (Patient not taking: Reported on  04/23/2018) 10 mL 1   No current facility-administered medications on file prior to visit.     Review of Systems Patient denies any headaches, blurred vision, shortness of breath, chest pain, abdominal pain, problems with bowel movements, urination, or intercourse.  Objective:  BP 101/69   Pulse 77   Ht 5\' 6"  (1.676 m)   Wt 192 lb (87.1 kg)   LMP 04/09/2018   BMI 30.99 kg/m  Physical Exam  General:  Well developed, well nourished, no acute distress. She is alert and oriented x3. Skin:  Warm and dry Neck:  Midline trachea, no thyromegaly or nodules Cardiovascular: Regular rate and rhythm, no murmur heard Lungs:  Effort normal, all lung fields clear to auscultation bilaterally Breasts:  No dominant palpable mass, retraction, or nipple discharge Abdomen:  Soft, non tender, no hepatosplenomegaly or masses Pelvic:  External genitalia is normal in appearance.  The vagina is normal in appearance. The cervix is bulbous, no CMT.  Thin prep pap is done with HR HPV cotesting. Uterus is felt to be normal size, shape, and contour.  No adnexal masses or tenderness noted. Extremities:  No swelling or varicosities noted Psych:  She has a normal mood and affect  Assessment:   Healthy well-woman exam Needs flu vaccine Obesity Hyperlipidemia Migraines  Plan:  Flu vaccine given Labs obtained will follow up accordingly Will try motrin with 25mg  Imitrex  as needed. Will restart weight loss medications- rtc 1 month for wt/BP F/U 1 year for AE, or sooner if needed   Delsy Etzkorn Suzan Nailer, CNM

## 2018-04-24 LAB — COMPREHENSIVE METABOLIC PANEL
ALBUMIN: 4.4 g/dL (ref 3.5–5.5)
ALK PHOS: 51 IU/L (ref 39–117)
ALT: 16 IU/L (ref 0–32)
AST: 23 IU/L (ref 0–40)
Albumin/Globulin Ratio: 1.6 (ref 1.2–2.2)
BILIRUBIN TOTAL: 0.3 mg/dL (ref 0.0–1.2)
BUN / CREAT RATIO: 15 (ref 9–23)
BUN: 11 mg/dL (ref 6–20)
CO2: 20 mmol/L (ref 20–29)
CREATININE: 0.73 mg/dL (ref 0.57–1.00)
Calcium: 9.8 mg/dL (ref 8.7–10.2)
Chloride: 101 mmol/L (ref 96–106)
GFR calc Af Amer: 126 mL/min/{1.73_m2} (ref >59)
GFR calc non Af Amer: 109 mL/min/{1.73_m2} (ref >59)
GLOBULIN, TOTAL: 2.8 g/dL (ref 1.5–4.5)
Glucose: 93 mg/dL (ref 65–99)
POTASSIUM: 4.1 mmol/L (ref 3.5–5.2)
SODIUM: 137 mmol/L (ref 134–144)
Total Protein: 7.2 g/dL (ref 6.0–8.5)

## 2018-04-24 LAB — NMR, LIPOPROFILE
CHOLESTEROL, TOTAL: 222 mg/dL — AB (ref 100–199)
HDL Particle Number: 43.2 umol/L (ref >=30.5)
HDL-C: 57 mg/dL (ref >39)
LDL PARTICLE NUMBER: 2076 nmol/L — AB (ref <1000)
LDL SIZE: 20.3 nm — AB (ref >20.5)
LDL-C: 120 mg/dL — ABNORMAL HIGH (ref 0–99)
LP-IR SCORE: 65 — AB (ref <=45)
Small LDL Particle Number: 1110 nmol/L — ABNORMAL HIGH (ref <=527)
Triglycerides: 226 mg/dL — ABNORMAL HIGH (ref 0–149)

## 2018-04-24 LAB — TSH: TSH: 1.64 u[IU]/mL (ref 0.450–4.500)

## 2018-04-28 LAB — CYTOLOGY - PAP
Diagnosis: NEGATIVE
HPV (WINDOPATH): NOT DETECTED

## 2018-05-21 ENCOUNTER — Encounter: Payer: Self-pay | Admitting: Obstetrics and Gynecology

## 2018-05-21 ENCOUNTER — Ambulatory Visit: Payer: Federal, State, Local not specified - PPO | Admitting: Obstetrics and Gynecology

## 2018-05-21 VITALS — BP 125/90 | HR 101 | Ht 66.0 in | Wt 184.3 lb

## 2018-05-21 DIAGNOSIS — E663 Overweight: Secondary | ICD-10-CM

## 2018-05-21 MED ORDER — CYANOCOBALAMIN 1000 MCG/ML IJ SOLN
1000.0000 ug | Freq: Once | INTRAMUSCULAR | Status: AC
  Administered 2018-05-21: 1000 ug via INTRAMUSCULAR

## 2018-05-21 NOTE — Progress Notes (Signed)
Pt is here for wt, bp check, b-12 inj She is doing well, denies any s/e  05/21/18 wt- 184.3lb 04/23/18 wt- 192lb  Waist 37 in

## 2018-06-03 DIAGNOSIS — H6692 Otitis media, unspecified, left ear: Secondary | ICD-10-CM | POA: Diagnosis not present

## 2018-06-03 DIAGNOSIS — J069 Acute upper respiratory infection, unspecified: Secondary | ICD-10-CM | POA: Diagnosis not present

## 2018-06-25 DIAGNOSIS — G44309 Post-traumatic headache, unspecified, not intractable: Secondary | ICD-10-CM | POA: Diagnosis not present

## 2018-06-25 DIAGNOSIS — H5711 Ocular pain, right eye: Secondary | ICD-10-CM | POA: Diagnosis not present

## 2018-07-24 ENCOUNTER — Other Ambulatory Visit: Payer: Self-pay | Admitting: Obstetrics and Gynecology

## 2018-07-30 ENCOUNTER — Other Ambulatory Visit: Payer: Self-pay | Admitting: Obstetrics and Gynecology

## 2018-11-28 ENCOUNTER — Other Ambulatory Visit: Payer: Self-pay | Admitting: Obstetrics and Gynecology

## 2019-01-08 ENCOUNTER — Other Ambulatory Visit: Payer: Self-pay | Admitting: Obstetrics and Gynecology

## 2019-02-14 DIAGNOSIS — Z20828 Contact with and (suspected) exposure to other viral communicable diseases: Secondary | ICD-10-CM | POA: Diagnosis not present

## 2019-02-14 DIAGNOSIS — J069 Acute upper respiratory infection, unspecified: Secondary | ICD-10-CM | POA: Diagnosis not present

## 2019-03-20 ENCOUNTER — Other Ambulatory Visit: Payer: Self-pay | Admitting: Obstetrics and Gynecology

## 2019-04-01 DIAGNOSIS — R05 Cough: Secondary | ICD-10-CM | POA: Diagnosis not present

## 2019-04-01 DIAGNOSIS — J069 Acute upper respiratory infection, unspecified: Secondary | ICD-10-CM | POA: Diagnosis not present

## 2019-04-14 ENCOUNTER — Other Ambulatory Visit: Payer: Self-pay | Admitting: Obstetrics and Gynecology

## 2019-05-13 ENCOUNTER — Other Ambulatory Visit: Payer: Self-pay | Admitting: Obstetrics and Gynecology

## 2019-06-28 DIAGNOSIS — Z20828 Contact with and (suspected) exposure to other viral communicable diseases: Secondary | ICD-10-CM | POA: Diagnosis not present

## 2019-09-14 ENCOUNTER — Other Ambulatory Visit: Payer: Self-pay

## 2019-09-14 MED ORDER — SERTRALINE HCL 50 MG PO TABS: 50.0000 mg | ORAL_TABLET | Freq: Two times a day (BID) | ORAL | 0 refills | Status: DC

## 2019-09-14 NOTE — Telephone Encounter (Signed)
Refill sent per faxed request from pharmacy. Patient will need an appointment before more refills will be authorized.

## 2019-09-18 DIAGNOSIS — H02055 Trichiasis without entropian left lower eyelid: Secondary | ICD-10-CM | POA: Diagnosis not present

## 2019-10-26 ENCOUNTER — Other Ambulatory Visit: Payer: Self-pay | Admitting: Certified Nurse Midwife

## 2019-10-28 ENCOUNTER — Other Ambulatory Visit: Payer: Self-pay

## 2019-11-20 ENCOUNTER — Encounter: Payer: Self-pay | Admitting: Obstetrics and Gynecology

## 2019-11-20 ENCOUNTER — Other Ambulatory Visit: Payer: Self-pay

## 2019-11-20 ENCOUNTER — Ambulatory Visit: Payer: Federal, State, Local not specified - PPO | Admitting: Obstetrics and Gynecology

## 2019-11-20 VITALS — BP 134/96 | HR 97 | Wt 202.5 lb

## 2019-11-20 DIAGNOSIS — N921 Excessive and frequent menstruation with irregular cycle: Secondary | ICD-10-CM | POA: Diagnosis not present

## 2019-11-20 DIAGNOSIS — Z975 Presence of (intrauterine) contraceptive device: Secondary | ICD-10-CM

## 2019-11-20 NOTE — Progress Notes (Signed)
HPI:      [name] is a 34 y.o. G3P0 who LMP was Patient's last menstrual period was 10/23/2019.  Subjective:   She presents today stating that she has been bleeding for almost a whole month.  She uses NuvaRing for birth control and she took her ring out last month had a menstrual period and it did not stop despite the fact that she placed her next during.  She has now completed the next NuvaRing and still has some continued vaginal bleeding. (This is the time of her usual menses and the ring is out)     Hx: The following portions of the patient's history were reviewed and updated as appropriate:             She  has a past medical history of Anemia and Anxiety. She does not have any pertinent problems on file. She  has a past surgical history that includes laparoscopy (04/2010). Her family history includes Cancer in her maternal grandmother; Heart disease in her paternal grandfather; Hyperlipidemia in her father and sister; Hypertension in her father and sister. She  reports that she has quit smoking. She has never used smokeless tobacco. She reports current alcohol use. She reports that she does not use drugs. She has a current medication list which includes the following prescription(s): etonogestrel-ethinyl estradiol, sertraline, and sumatriptan. She is allergic to penicillins.       Review of Systems:  Review of Systems  Constitutional: Denied constitutional symptoms, night sweats, recent illness, fatigue, fever, insomnia and weight loss.  Eyes: Denied eye symptoms, eye pain, photophobia, vision change and visual disturbance.  Ears/Nose/Throat/Neck: Denied ear, nose, throat or neck symptoms, hearing loss, nasal discharge, sinus congestion and sore throat.  Cardiovascular: Denied cardiovascular symptoms, arrhythmia, chest pain/pressure, edema, exercise intolerance, orthopnea and palpitations.  Respiratory: Denied pulmonary symptoms, asthma, pleuritic pain, productive  sputum, cough, dyspnea and wheezing.  Gastrointestinal: Denied, gastro-esophageal reflux, melena, nausea and vomiting.  Genitourinary: Denied genitourinary symptoms including symptomatic vaginal discharge, pelvic relaxation issues, and urinary complaints.  Musculoskeletal: Denied musculoskeletal symptoms, stiffness, swelling, muscle weakness and myalgia.  Dermatologic: Denied dermatology symptoms, rash and scar.  Neurologic: Denied neurology symptoms, dizziness, headache, neck pain and syncope.  Psychiatric: Denied psychiatric symptoms, anxiety and depression.  Endocrine: Denied endocrine symptoms including hot flashes and night sweats.   Meds:   Current Outpatient Medications on File Prior to Visit  Medication Sig Dispense Refill  . etonogestrel-ethinyl estradiol (NUVARING) 0.12-0.015 MG/24HR vaginal ring INSERT 1 RING IN THE VAGINA FOR 3 WEEKS, THEM REMOVE FOR 1 WEEK 1 each 11  . sertraline (ZOLOFT) 50 MG tablet Take 1 tablet (50 mg total) by mouth 2 (two) times daily. 60 tablet 0  . SUMAtriptan (IMITREX) 25 MG tablet TAKE 1 TABLET BY MOUTH EVERY 2 HOURS AS NEEDED FOR MIGRAINE. MAY REPEAT IN 2 HOURS IF HEADACHE PERSISTS OR REOCCURS 10 tablet 0   No current facility-administered medications on file prior to visit.    Objective:     Vitals:   11/20/19 1129  BP: (!) 134/96  Pulse: 97              Physical examination   Pelvic:   Vulva: Normal appearance.  No lesions.  Vagina: No lesions or abnormalities noted.  Support: Normal pelvic support.  Urethra No masses tenderness or scarring.  Meatus Normal size without lesions or prolapse.  Cervix: Normal appearance.  No lesions.  Anus: Normal exam.  No lesions.  Perineum: Normal exam.  No lesions.        Bimanual   Uterus: Normal size.  Non-tender.  Mobile.  AV.  Adnexae: No masses.  Non-tender to palpation.  Cul-de-sac: Negative for abnormality.     Assessment:    G3P0 Patient Active Problem List   Diagnosis Date Noted  .  Elevated cholesterol with elevated triglycerides 07/06/2015  . Obese 07/05/2015     1. Breakthrough bleeding with NuvaRing     No obvious source of bleeding.   Plan:            1.  Multiple options discussed including expectant management, progesterone support to stop bleeding, use of OCPs for short time etc.  Patient has decided upon expectant management.  If her bleeding continues despite next cycle of NuvaRing we will plan just her on support to give her a break from the bleeding. Orders No orders of the defined types were placed in this encounter.   No orders of the defined types were placed in this encounter.     F/U  Return in about 3 months (around 02/20/2020) for Annual Physical, Pt to contact us if symptoms worsen. I spent 22 minutes involved in the care of this patient preparing to see the patient by obtaining and reviewing her medical history (including labs, imaging tests and prior procedures), documenting clinical information in the electronic health record (EHR), counseling and coordinating care plans, writing and sending prescriptions, ordering tests or procedures and directly communicating with the patient by discussing pertinent items from her history and physical exam as well as detailing my assessment and plan as noted above so that she has an informed understanding.  All of her questions were answered.  Elonda Husky, M.D. 11/20/2019 11:56 AM

## 2020-04-26 ENCOUNTER — Telehealth: Payer: Self-pay | Admitting: Surgical

## 2020-04-26 MED ORDER — ETONOGESTREL-ETHINYL ESTRADIOL 0.12-0.015 MG/24HR VA RING: VAGINAL_RING | VAGINAL | 0 refills | Status: DC

## 2020-04-26 NOTE — Telephone Encounter (Signed)
LM for patient to call office to schedule a physical. Patient is requesting [facility] refill. Will refill one ring that will last 30 days.

## 2020-04-28 ENCOUNTER — Other Ambulatory Visit: Payer: Self-pay | Admitting: Obstetrics and Gynecology

## 2020-04-28 NOTE — Telephone Encounter (Signed)
Please advise on refill. Patient has not had a physical since 04/2018. I filled a 30 day supply 2 days ago. I have left two messages for patient to return call to schedule a physical. You seen her in 11/2019 for problem visit.

## 2020-07-07 ENCOUNTER — Encounter: Payer: Self-pay | Admitting: Otolaryngology

## 2020-07-07 ENCOUNTER — Other Ambulatory Visit: Payer: Self-pay

## 2020-07-15 ENCOUNTER — Other Ambulatory Visit
Admission: RE | Admit: 2020-07-15 | Discharge: 2020-07-15 | Disposition: A | Discharge status: Home / Self Care | Payer: Federal, State, Local not specified - PPO | Source: Ambulatory Visit | Attending: Otolaryngology | Admitting: Otolaryngology

## 2020-07-15 ENCOUNTER — Other Ambulatory Visit: Payer: Self-pay

## 2020-07-15 DIAGNOSIS — Z01812 Encounter for preprocedural laboratory examination: Secondary | ICD-10-CM | POA: Insufficient documentation

## 2020-07-15 DIAGNOSIS — Z20822 Contact with and (suspected) exposure to covid-19: Secondary | ICD-10-CM | POA: Insufficient documentation

## 2020-07-16 LAB — SARS CORONAVIRUS 2 (TAT 6-24 HRS): SARS Coronavirus 2: NEGATIVE

## 2020-07-19 ENCOUNTER — Ambulatory Visit: Payer: Federal, State, Local not specified - PPO | Admitting: Anesthesiology

## 2020-07-19 ENCOUNTER — Ambulatory Visit
Admission: RE | Admit: 2020-07-19 | Discharge: 2020-07-19 | Disposition: A | Discharge status: Home / Self Care | Payer: Federal, State, Local not specified - PPO | Attending: Otolaryngology | Admitting: Otolaryngology

## 2020-07-19 ENCOUNTER — Encounter
Admission: RE | Disposition: A | Discharge status: Home / Self Care | Payer: Self-pay | Source: Home / Self Care | Attending: Otolaryngology

## 2020-07-19 ENCOUNTER — Encounter: Payer: Self-pay | Admitting: Otolaryngology

## 2020-07-19 ENCOUNTER — Other Ambulatory Visit: Payer: Self-pay

## 2020-07-19 DIAGNOSIS — Z87891 Personal history of nicotine dependence: Secondary | ICD-10-CM | POA: Diagnosis not present

## 2020-07-19 DIAGNOSIS — J320 Chronic maxillary sinusitis: Secondary | ICD-10-CM | POA: Diagnosis not present

## 2020-07-19 DIAGNOSIS — J343 Hypertrophy of nasal turbinates: Secondary | ICD-10-CM | POA: Diagnosis not present

## 2020-07-19 DIAGNOSIS — Z79899 Other long term (current) drug therapy: Secondary | ICD-10-CM | POA: Insufficient documentation

## 2020-07-19 DIAGNOSIS — J342 Deviated nasal septum: Secondary | ICD-10-CM | POA: Diagnosis not present

## 2020-07-19 DIAGNOSIS — Z793 Long term (current) use of hormonal contraceptives: Secondary | ICD-10-CM | POA: Diagnosis not present

## 2020-07-19 DIAGNOSIS — J322 Chronic ethmoidal sinusitis: Secondary | ICD-10-CM | POA: Diagnosis not present

## 2020-07-19 HISTORY — PX: MAXILLARY ANTROSTOMY: SHX2003

## 2020-07-19 HISTORY — PX: NASAL SEPTOPLASTY W/ TURBINOPLASTY: SHX2070

## 2020-07-19 HISTORY — DX: Motion sickness, initial encounter: T75.3XXA

## 2020-07-19 HISTORY — PX: IMAGE GUIDED SINUS SURGERY: SHX6570

## 2020-07-19 HISTORY — PX: ETHMOIDECTOMY: SHX5197

## 2020-07-19 HISTORY — DX: Headache, unspecified: R51.9

## 2020-07-19 LAB — POCT PREGNANCY, URINE: Preg Test, Ur: NEGATIVE

## 2020-07-19 SURGERY — SINUS SURGERY, WITH IMAGING GUIDANCE
Anesthesia: General | Site: Nose

## 2020-07-19 MED ORDER — HYDROCODONE-ACETAMINOPHEN 5-325 MG PO TABS
1.0000 | ORAL_TABLET | Freq: Four times a day (QID) | ORAL | 0 refills | Status: DC | PRN
Start: 2020-07-19 — End: 2021-11-16

## 2020-07-19 MED ORDER — LIDOCAINE-EPINEPHRINE 1 %-1:100000 IJ SOLN
INTRAMUSCULAR | Status: DC | PRN
  Administered 2020-07-19: 9 mL
  Administered 2020-07-19: 11 mL

## 2020-07-19 MED ORDER — OXYMETAZOLINE HCL 0.05 % NA SOLN
NASAL | Status: DC | PRN
  Administered 2020-07-19: 1 via TOPICAL

## 2020-07-19 MED ORDER — CEFDINIR 300 MG PO CAPS: 300.0000 mg | ORAL_CAPSULE | Freq: Two times a day (BID) | ORAL | 0 refills | Status: DC

## 2020-07-19 MED ORDER — LACTATED RINGERS IV SOLN: INTRAVENOUS | Status: DC

## 2020-07-19 MED ORDER — FENTANYL CITRATE (PF) 100 MCG/2ML IJ SOLN
25.0000 ug | INTRAMUSCULAR | Status: DC | PRN
  Administered 2020-07-19 (×2): 25 ug via INTRAVENOUS
  Administered 2020-07-19: 50 ug via INTRAVENOUS

## 2020-07-19 MED ORDER — SUCCINYLCHOLINE CHLORIDE 20 MG/ML IJ SOLN
INTRAMUSCULAR | Status: DC | PRN
  Administered 2020-07-19: 100 mg via INTRAVENOUS

## 2020-07-19 MED ORDER — ONDANSETRON HCL 4 MG/2ML IJ SOLN
4.0000 mg | Freq: Once | INTRAMUSCULAR | Status: AC | PRN
  Administered 2020-07-19: 4 mg via INTRAVENOUS

## 2020-07-19 MED ORDER — SCOPOLAMINE 1 MG/3DAYS TD PT72
1.0000 | MEDICATED_PATCH | Freq: Once | TRANSDERMAL | Status: DC
  Administered 2020-07-19: 1.5 mg via TRANSDERMAL

## 2020-07-19 MED ORDER — FENTANYL CITRATE (PF) 100 MCG/2ML IJ SOLN
INTRAMUSCULAR | Status: DC | PRN
  Administered 2020-07-19 (×2): 50 ug via INTRAVENOUS
  Administered 2020-07-19: 25 ug via INTRAVENOUS

## 2020-07-19 MED ORDER — OXYCODONE HCL 5 MG/5ML PO SOLN
5.0000 mg | Freq: Once | ORAL | Status: AC | PRN
  Administered 2020-07-19: 5 mg via ORAL

## 2020-07-19 MED ORDER — DEXAMETHASONE SODIUM PHOSPHATE 4 MG/ML IJ SOLN
INTRAMUSCULAR | Status: DC | PRN
  Administered 2020-07-19: 10 mg via INTRAVENOUS

## 2020-07-19 MED ORDER — ACETAMINOPHEN 10 MG/ML IV SOLN
1000.0000 mg | Freq: Once | INTRAVENOUS | Status: AC
  Administered 2020-07-19: 1000 mg via INTRAVENOUS

## 2020-07-19 MED ORDER — ONDANSETRON HCL 4 MG/2ML IJ SOLN
INTRAMUSCULAR | Status: DC | PRN
  Administered 2020-07-19: 4 mg via INTRAVENOUS

## 2020-07-19 MED ORDER — MIDAZOLAM HCL 5 MG/5ML IJ SOLN
INTRAMUSCULAR | Status: DC | PRN
  Administered 2020-07-19: 2 mg via INTRAVENOUS

## 2020-07-19 MED ORDER — GLYCOPYRROLATE 0.2 MG/ML IJ SOLN
INTRAMUSCULAR | Status: DC | PRN
  Administered 2020-07-19: .1 mg via INTRAVENOUS

## 2020-07-19 MED ORDER — PROPOFOL 10 MG/ML IV BOLUS
INTRAVENOUS | Status: DC | PRN
  Administered 2020-07-19: 200 mg via INTRAVENOUS

## 2020-07-19 MED ORDER — OXYCODONE HCL 5 MG PO TABS: 5.0000 mg | ORAL_TABLET | Freq: Once | ORAL | Status: AC | PRN

## 2020-07-19 MED ORDER — LIDOCAINE HCL (CARDIAC) PF 100 MG/5ML IV SOSY
PREFILLED_SYRINGE | INTRAVENOUS | Status: DC | PRN
  Administered 2020-07-19: 50 mg via INTRAVENOUS

## 2020-07-19 SURGICAL SUPPLY — 30 items
BATTERY INSTRU NAVIGATION (MISCELLANEOUS) ×9 IMPLANT
CANISTER SUCT 1200ML W/VALVE (MISCELLANEOUS) ×3 IMPLANT
COAG SUCT 10F 3.5MM HAND CTRL (MISCELLANEOUS) ×3 IMPLANT
ELECT REM PT RETURN 9FT ADLT (ELECTROSURGICAL) ×3
ELECTRODE REM PT RTRN 9FT ADLT (ELECTROSURGICAL) ×2 IMPLANT
GLOVE BIO SURGEON STRL SZ7.5 (GLOVE) ×9 IMPLANT
GOWN STRL REUS W/ TWL LRG LVL3 (GOWN DISPOSABLE) ×2 IMPLANT
GOWN STRL REUS W/TWL LRG LVL3 (GOWN DISPOSABLE) ×1
IV NS 500ML (IV SOLUTION) ×1
IV NS 500ML BAXH (IV SOLUTION) ×2 IMPLANT
KIT TURNOVER KIT A (KITS) ×3 IMPLANT
NEEDLE HYPO 25GX1X1/2 BEV (NEEDLE) ×3 IMPLANT
NS IRRIG 500ML POUR BTL (IV SOLUTION) ×3 IMPLANT
PACK ENT CUSTOM (PACKS) ×3 IMPLANT
PACKING NASAL EPIS 4X2.4 XEROG (MISCELLANEOUS) ×6 IMPLANT
PATTIES SURGICAL .5 X3 (DISPOSABLE) ×3 IMPLANT
SHAVER DIEGO BLD STD TYPE A (BLADE) ×3 IMPLANT
SOL ANTI-FOG 6CC FOG-OUT (MISCELLANEOUS) ×2 IMPLANT
SOL FOG-OUT ANTI-FOG 6CC (MISCELLANEOUS) ×1
SPLINT NASAL SEPTAL PRE-CUT (MISCELLANEOUS) ×3 IMPLANT
SUT CHROMIC 4 0 RB 1X27 (SUTURE) ×3 IMPLANT
SUT ETHILON 4-0 (SUTURE) ×1
SUT ETHILON 4-0 FS2 18XMFL BLK (SUTURE) ×2
SUT PLAIN GUT 4-0 (SUTURE) ×3 IMPLANT
SUTURE ETHLN 4-0 FS2 18XMF BLK (SUTURE) ×2 IMPLANT
SYR 10ML LL (SYRINGE) ×3 IMPLANT
TOWEL OR 17X26 4PK STRL BLUE (TOWEL DISPOSABLE) ×3 IMPLANT
TRACKER CRANIALMASK (MASK) ×3 IMPLANT
TUBING DECLOG MULTIDEBRIDER (TUBING) ×3 IMPLANT
WATER STERILE IRR 250ML POUR (IV SOLUTION) ×3 IMPLANT

## 2020-07-19 NOTE — H&P (Signed)
History and physical reviewed and will be scanned in later. No change in medical status reported by the patient or family, appears stable for surgery. All questions regarding the procedure answered, and patient (or family if a child) expressed understanding of the procedure. ? ?Michelle Gordon S Michelle Gordon ?@TODAY@ ?

## 2020-07-19 NOTE — Anesthesia Procedure Notes (Signed)
Procedure Name: Intubation Date/Time: 07/19/2020 11:00 AM Performed by: Jimmy Picket, CRNA Pre-anesthesia Checklist: Patient identified, Emergency Drugs available, Suction available, Patient being monitored and Timeout performed Patient Re-evaluated:Patient Re-evaluated prior to induction Oxygen Delivery Method: Circle system utilized Preoxygenation: Pre-oxygenation with 100% oxygen Induction Type: IV induction Ventilation: Mask ventilation without difficulty Laryngoscope Size: Miller and 2 Grade View: Grade I Tube type: Oral Rae Tube size: 7.0 mm Number of attempts: 1 Placement Confirmation: ETT inserted through vocal cords under direct vision,  positive ETCO2 and breath sounds checked- equal and bilateral Tube secured with: Tape Dental Injury: Teeth and Oropharynx as per pre-operative assessment

## 2020-07-19 NOTE — Transfer of Care (Signed)
Immediate Anesthesia Transfer of Care Note  Patient: Michelle Gordon  Procedure(s) Performed: IMAGE GUIDED SINUS SURGERY (N/A Nose) NASAL SEPTOPLASTY WITH SUBMUCOUS RESECTION OF TURBINATES (Bilateral Nose) MAXILLARY ANTROSTOMY WITH TISSUE REMOVAL (Bilateral Nose) TOTAL ETHMOIDECTOMY (Bilateral Nose)  Patient Location: PACU  Anesthesia Type: General  Level of Consciousness: awake, alert  and patient cooperative  Airway and Oxygen Therapy: Patient Spontanous Breathing and Patient connected to supplemental oxygen  Post-op Assessment: Post-op Vital signs reviewed, Patient's Cardiovascular Status Stable, Respiratory Function Stable, Patent Airway and No signs of Nausea or vomiting  Post-op Vital Signs: Reviewed and stable  Complications: No complications documented.

## 2020-07-19 NOTE — Anesthesia Postprocedure Evaluation (Signed)
Anesthesia Post Note  Patient: [name]  Procedure(s) Performed: IMAGE GUIDED SINUS SURGERY (N/A Nose) NASAL SEPTOPLASTY WITH SUBMUCOUS RESECTION OF TURBINATES (Bilateral Nose) MAXILLARY ANTROSTOMY WITH TISSUE REMOVAL (Bilateral Nose) TOTAL ETHMOIDECTOMY (Bilateral Nose)     Patient location during evaluation: PACU Anesthesia Type: General Level of consciousness: awake and alert Pain management: pain level controlled Vital Signs Assessment: post-procedure vital signs reviewed and stable Respiratory status: spontaneous breathing, nonlabored ventilation, respiratory function stable and patient connected to nasal cannula oxygen Cardiovascular status: blood pressure returned to baseline and stable Postop Assessment: no apparent nausea or vomiting Anesthetic complications: no   No complications documented.  Michelle Gordon

## 2020-07-19 NOTE — Anesthesia Preprocedure Evaluation (Addendum)
Anesthesia Evaluation  Patient identified by MRN, date of birth, ID band Patient awake    Reviewed: Allergy & Precautions, H&P , NPO status , Patient's Chart, lab work & pertinent test results, reviewed documented beta blocker date and time   Airway Mallampati: II  TM Distance: >3 FB Neck ROM: full    Dental no notable dental hx.    Pulmonary neg pulmonary ROS, former smoker,    Pulmonary exam normal breath sounds clear to auscultation       Cardiovascular Exercise Tolerance: Good negative cardio ROS   Rhythm:regular Rate:Normal     Neuro/Psych  Headaches, Anxiety    GI/Hepatic negative GI ROS, Neg liver ROS,   Endo/Other  negative endocrine ROS  Renal/GU negative Renal ROS  negative genitourinary   Musculoskeletal   Abdominal   Peds  Hematology  (+) Blood dyscrasia, anemia ,   Anesthesia Other Findings   Reproductive/Obstetrics negative OB ROS                             Anesthesia Physical Anesthesia Plan  ASA: II  Anesthesia Plan: General   Post-op Pain Management:    Induction:   PONV Risk Score and Plan: 3 and Treatment may vary due to age or medical condition  Airway Management Planned:   Additional Equipment:   Intra-op Plan:   Post-operative Plan:   Informed Consent: I have reviewed the patients History and Physical, chart, labs and discussed the procedure including the risks, benefits and alternatives for the proposed anesthesia with the patient or authorized representative who has indicated his/her understanding and acceptance.     Dental Advisory Given  Plan Discussed with: CRNA  Anesthesia Plan Comments:         Anesthesia Quick Evaluation

## 2020-07-19 NOTE — Op Note (Signed)
07/19/2020  12:48 PM    Michelle Gordon  [id number]   Pre-Op Diagnosis:  deviated septum, inferior turbinate hypertrophy, chronic sinusitis  Post-op Diagnosis: deviated septum, inferior turbinate hypertrophy, chronic sinusitis  Procedure:  1)  Image Guided Sinus Surgery,   2)  Bilateral Endoscopic Maxillary Antrostomy with Tissue Removal   3)  Bilateral Total Ethmoidectomy   4)  Nasal Septoplasty   5)  Bilateral submucous resection of the inferior turbinates    Surgeon:  Sandi Mealy  Anesthesia:  General endotracheal  EBL:  50cc  Complications:  None  Findings: Right septal deviation, bilateral inferior turbinate hypertrophy, sinus mucosal thickening  Procedure: After the patient was identified in holding and the benefits of the procedure were reviewed as well as the consent and risks, the patient was taken to the operating room and with the patient in a comfortable supine position,  general orotracheal anesthesia was induced without difficulty.  A proper time-out was performed.  The [employer] image guidance system was set up and calibrated in the normal fashion and felt to be acceptable.  Next 1% Xylocaine with 1:100,000 epinephrine was infiltrated into the inferior turbinates, septum, and anterior middle turbinates and uncinate region  bilaterally.  Several minutes were allowed for this to take effect.  Cottoniod pledgets soaked in Afrin were placed into both nasal cavities and left while the patient was prepped and draped in the standard fashion. The image guided suction was calibrated and used to inspect known points in the nasal cavity to assess accuracy of the image guided system. Accuracy was felt to be excellent.   Beginning on the left hand side a hemitransfixion incision was then created on the leading edge of the septum on the left.  A subperichondrial plane was elevated posteriorly on the left and taken back to the perpendicular plate of the ethmoid where  subperiosteal plane was elevated posteriorly on the left.  An inferior rim of redundant septal cartilage was removed from where it deviated over the maxillary crest. The perpendicular plate of the ethmoid was separated from the quadrangular cartilage, and a subperiosteal plane elevated on the right of the bony septum. The deviated portion of the septal bone was removed, dividing the septal bone superiorly with Knight scissors, and inferiorly from the maxillary crest with a chisel. Mucoperichondrium was raised over the right side of the septal cartilage, followed by scoring the left side of the cartilage with a 15 blade to break up the "memory" of the cartilage, allowing it to relax into the midline.  The septum was then replaced in the midline. Reinspection through each nostril showed excellent reduction of the septal deformity. A left posterior inferior fenestration was then created to allow hematoma drainage.  The septal incision was closed with 4-0 chromic gut suture. A 4-0 plain gut suture was used to reapproximate the septal flaps to the underlying cartilage, utilizing a running, quilting type stitch.   The left middle turbinate was medialized and the uncinate process then resected with through-cutting forceps as well as the microdebrider. In this fashion the uncinate was completely removed along with soft tissue and bone of the medial wall of the maxillary sinus to create a large patent maxillary antrostomy. The left maxillary sinus was suctioned to clear secretions.   Next the left anterior ethmoid sinuses were dissected beginning inferomedially, entering the ethmoid bulla. Thru cut forceps were used to open the anterior ethmoids. The microdebrider was used as needed to trim loose mucosal edges.  Next the  basal lamella was entered and, working back in a sequential fashion through the ethmoid air celss, the ethmoid sinuses were dissected to the posterior ethmoid sinuses, utilizing the image guided  suction and the whole time to reassess the anatomy frequently. Thru cutting forceps were used for this dissection. Care was taken to avoid injury to the lamina papyracea laterally and the skull base superiorly. The opacified posterior cell was located with the image guided suction and opened, removing thick mucous.   Attention was then turned to the right side where the same procedure was performed, opening the maxillary sinus and ethmoid cavities in the same fashion as described above, making sure the inflamed posterior air cell was opened.   Next, beginning on the left-hand side, a 15 blade was used to incise along the inferior edge of the inferior turbinate. A superior laterally based flap of the medial turbinate mucosa was then elevated. A portion of the underlying conchal bone and lateral mucosa was excised using Knight scissors. The flap was then laid back over the turbinate stump and the bleeding edge of the turbinate cauterized using suction cautery. In a similar fashion the submucous resection was performed on the right.  With the submucous resection completed bilaterally and no active bleeding, nasal septal splints were placed within each nostril and affixed to the septum using a 3-0 nylon suture.   The nose was suctioned and inspected. The maxillary sinuses were irrigated with saline. Xerogel absorbable sinus packing was then placed in the ethmoid cavities bilaterally.   The patient was then returned to the anesthesiologist for awakening and taken to recovery room in good condition postoperatively.  Disposition:   PACU and d/c home  Plan: Ice, elevation, narcotic analgesia and prophylactic antibiotics. Begin sinus irrigations with saline tomorrow, irrigating 3-4 times daily. Return to the office in 7 days.  Return to work in 7-10 days, no strenuous activities for two weeks.   Sandi Mealy 07/19/2020 12:48 PM

## 2020-07-19 NOTE — Discharge Instructions (Signed)
[facility] °[facility] °ENDOSCOPIC SINUS SURGERY °[facility] ° °What is Functional Endoscopic Sinus Surgery? ° The Surgery involves making the natural openings of the sinuses larger by removing the bony partitions that separate the sinuses from the nasal cavity.  The natural sinus lining is preserved as much as possible to allow the sinuses to resume normal function after the surgery.  In some patients nasal polyps (excessively swollen lining of the sinuses) may be removed to relieve obstruction of the sinus openings.  The surgery is performed through the nose using lighted scopes, which eliminates the need for incisions on the face.  A septoplasty is a different procedure which is sometimes performed with sinus surgery.  It involves straightening the boy partition that separates the two sides of your nose.  A crooked or deviated septum may need repair if is obstructing the sinuses or nasal airflow.  Turbinate reduction is also often performed during sinus surgery.  The turbinates are bony proturberances from the side walls of the nose which swell and can obstruct the nose in patients with sinus and allergy problems.  Their size can be surgically reduced to help relieve nasal obstruction. ° °What Can Sinus Surgery Do For Me? ° Sinus surgery can reduce the frequency of sinus infections requiring antibiotic treatment.  This can provide improvement in nasal congestion, post-nasal drainage, facial pressure and nasal obstruction.  Surgery will NOT prevent you from ever having an infection again, so it usually only for patients who get infections 4 or more times yearly requiring antibiotics, or for infections that do not clear with antibiotics.  It will not cure nasal allergies, so patients with allergies may still require medication to treat their allergies after surgery. Surgery may improve headaches related to sinusitis, however, some people will continue to  require medication to control sinus headaches related to allergies.  Surgery will do nothing for other forms of headache (migraine, tension or cluster). ° °What Are the Risks of Endoscopic Sinus Surgery? ° Current techniques allow surgery to be performed safely with little risk, however, there are rare complications that patients should be aware of.  Because the sinuses are located around the eyes, there is risk of eye injury, including blindness, though again, this would be quite rare. This is usually a result of bleeding behind the eye during surgery, which puts the vision oat risk, though there are treatments to protect the vision and prevent permanent disrupted by surgery causing a leak of the spinal fluid that surrounds the brain.  More serious complications would include bleeding inside the brain cavity or damage to the brain.  Again, all of these complications are uncommon, and spinal fluid leaks can be safely managed surgically if they occur.  The most common complication of sinus surgery is bleeding from the nose, which may require packing or cauterization of the nose.  Continued sinus have polyps may experience recurrence of the polyps requiring revision surgery.  Alterations of sense of smell or injury to the tear ducts are also rare complications.  ° °What is the Surgery Like, and what is the Recovery? ° The Surgery usually takes a couple of hours to perform, and is usually performed under a general anesthetic (completely asleep).  Patients are usually discharged home after a couple of hours.  Sometimes during surgery it is necessary to pack the nose to control bleeding, and the packing is left in place for 24 - 48 hours, and removed by your surgeon.    If a septoplasty was performed during the procedure, there is often a splint placed which must be removed after 5-7 days.   °Discomfort: Pain is usually mild to moderate, and can be controlled by prescription pain medication or acetaminophen (Tylenol).   Aspirin, Ibuprofen (Advil, Motrin), or Naprosyn (Aleve) should be avoided, as they can cause increased bleeding.  Most patients feel sinus pressure like they have a bad head cold for several days.  Sleeping with your head elevated can help reduce swelling and facial pressure, as can ice packs over the face.  A humidifier may be helpful to keep the mucous and blood from drying in the nose.  ° °Diet: There are no specific diet restrictions, however, you should generally start with clear liquids and a light diet of bland foods because the anesthetic can cause some nausea.  Advance your diet depending on how your stomach feels.  Taking your pain medication with food will often help reduce stomach upset which pain medications can cause. ° °Nasal Saline Irrigation: It is important to remove blood clots and dried mucous from the nose as it is healing.  This is done by having you irrigate the nose at least 3 - 4 times daily with a salt water solution.  We recommend using NeilMed Sinus Rinse (available at the drug store).  Fill the squeeze bottle with the solution, bend over a sink, and insert the tip of the squeeze bottle into the nose ½ of an inch.  Point the tip of the squeeze bottle towards the inside corner of the eye on the same side your irrigating.  Squeeze the bottle and gently irrigate the nose.  If you bend forward as you do this, most of the fluid will flow back out of the nose, instead of down your throat.   The solution should be warm, near body temperature, when you irrigate.   Each time you irrigate, you should use a full squeeze bottle.  ° °Note that if you are instructed to use Nasal Steroid Sprays at any time after your surgery, irrigate with saline BEFORE using the steroid spray, so you do not wash it all out of the nose. °Another product, Nasal Saline Gel (such as AYR Nasal Saline Gel) can be applied in each nostril 3 - 4 times daily to moisture the nose and reduce scabbing or crusting. ° °Bleeding:   Bloody drainage from the nose can be expected for several days, and patients are instructed to irrigate their nose frequently with salt water to help remove mucous and blood clots.  The drainage may be dark red or brown, though some fresh blood may be seen intermittently, especially after irrigation.  Do not blow you nose, as bleeding may occur. If you must sneeze, keep your mouth open to allow air to escape through your mouth. ° °If heavy bleeding occurs: Irrigate the nose with saline to rinse out clots, then spray the nose 3 - 4 times with Afrin Nasal Decongestant Spray.  The spray will constrict the blood vessels to slow bleeding.  Pinch the lower half of your nose shut to apply pressure, and lay down with your head elevated.  Ice packs over the nose may help as well. If bleeding persists despite these measures, you should notify your doctor.  Do not use the Afrin routinely to control nasal congestion after surgery, as it can result in worsening congestion and may affect healing.  ° ° ° °Activity: Return to work varies among patients. Most patients will be   work at least 5 - 7 days to recover.  Patient may return to work after they are off of narcotic pain medication, and feeling well enough to perform the functions of their job.  Patients must avoid heavy lifting (over 10 pounds) or strenuous physical for 2 weeks after surgery, so your employer may need to assign you to light duty, or keep you out of work longer if light duty is not possible.  NOTE: you should not drive, operate dangerous machinery, do any mentally demanding tasks or make any important legal or financial decisions while on narcotic pain medication and recovering from the general anesthetic.  °  °Call Your Doctor Immediately if You Have Any of the Following: °1. Bleeding that you cannot control with the above measures °2. Loss of vision, double vision, bulging of the eye or black eyes. °3. Fever over 101 degrees °4. Neck stiffness with severe  headache, fever, nausea and change in mental state. °You are always encourage to call anytime with concerns, however, please call with requests for pain medication refills during office hours. ° °Office Endoscopy: During follow-up visits your doctor will remove any packing or splints that may have been placed and evaluate and clean your sinuses endoscopically.  Topical anesthetic will be used to make this as comfortable as possible, though you may want to take your pain medication prior to the visit.  How often this will need to be done varies from patient to patient.  After complete recovery from the surgery, you may need follow-up endoscopy from time to time, particularly if there is concern of recurrent infection or nasal polyps. ° ° °General Anesthesia, Adult, Care After °This sheet gives you information about how to care for yourself after your procedure. Your health care provider may also give you more specific instructions. If you have problems or questions, contact your health care provider. °What can I expect after the procedure? °After the procedure, the following side effects are common: °· Pain or discomfort at the IV site. °· Nausea. °· Vomiting. °· Sore throat. °· Trouble concentrating. °· Feeling cold or chills. °· Weak or tired. °· Sleepiness and fatigue. °· Soreness and body aches. These side effects can affect parts of the body that were not involved in surgery. °Follow these instructions at home: ° °For at least 24 hours after the procedure: °· Have a responsible adult stay with you. It is important to have someone help care for you until you are awake and alert. °· Rest as needed. °· Do not: °? Participate in activities in which you could fall or become injured. °? Drive. °? Use heavy machinery. °? Drink alcohol. °? Take sleeping pills or medicines that cause drowsiness. °? Make important decisions or sign legal documents. °? Take care of children on your own. °Eating and drinking °· Follow any  instructions from your health care provider about eating or drinking restrictions. °· When you feel hungry, start by eating small amounts of foods that are soft and easy to digest (bland), such as toast. Gradually return to your regular diet. °· Drink enough fluid to keep your urine pale yellow. °· If you vomit, rehydrate by drinking water, juice, or clear broth. °General instructions °· If you have sleep apnea, surgery and certain medicines can increase your risk for breathing problems. Follow instructions from your health care provider about wearing your sleep device: °? Anytime you are sleeping, including during daytime naps. °? While taking prescription pain medicines, sleeping medicines, or medicines that make   that make you drowsy.  Return to your normal activities as told by your health care provider. Ask your health care provider what activities are safe for you.  Take over-the-counter and prescription medicines only as told by your health care provider.  If you smoke, do not smoke without supervision.  Keep all follow-up visits as told by your health care provider. This is important. Contact a health care provider if:  You have nausea or vomiting that does not get better with medicine.  You cannot eat or drink without vomiting.  You have pain that does not get better with medicine.  You are unable to pass urine.  You develop a skin rash.  You have a fever.  You have redness around your IV site that gets worse. Get help right away if:  You have difficulty breathing.  You have chest pain.  You have blood in your urine or stool, or you vomit blood. Summary  After the procedure, it is common to have a sore throat or nausea. It is also common to feel tired.  Have a responsible adult stay with you for the first 24 hours after general anesthesia. It is important to have someone help care for you until you are awake and alert.  When you feel hungry, start by eating  small amounts of foods that are soft and easy to digest (bland), such as toast. Gradually return to your regular diet.  Drink enough fluid to keep your urine pale yellow.  Return to your normal activities as told by your health care provider. Ask your health care provider what activities are safe for you. This information is not intended to replace advice given to you by your health care provider. Make sure you discuss any questions you have with your health care provider. Document Revised: 07/05/2017 Document Reviewed: 02/15/2017 Elsevier Patient Education  2020 Elsevier Inc.  Scopolamine skin patches REMOVE IN 72 HRS & WASH HANDS IMMEDIATELY AFTER REMOVAL.  What is this medicine? SCOPOLAMINE (skoe POL a meen) is used to prevent nausea and vomiting caused by motion sickness, anesthesia and surgery. This medicine may be used for other purposes; ask your health care provider or pharmacist if you have questions. COMMON BRAND NAME(S): Transderm Scop What should I tell my health care provider before I take this medicine? They need to know if you have any of these conditions:  are scheduled to have a gastric secretion test  glaucoma  heart disease  kidney disease  liver disease  lung or breathing disease, like asthma  mental illness  prostate disease  seizures  stomach or intestine problems  trouble passing urine  an unusual or allergic reaction to scopolamine, atropine, other medicines, foods, dyes, or preservatives  pregnant or trying to get pregnant  breast-feeding How should I use this medicine? This medicine is for external use only. Follow the directions on the prescription label. Wear only 1 patch at a time. Choose an area behind the ear, that is clean, dry, hairless and free from any cuts or irritation. Wipe the area with a clean dry tissue. Peel off the plastic backing of the skin patch, trying not to touch the adhesive side with your hands. Do not cut the patches.  Firmly apply to the area you have chosen, with the metallic side of the patch to the skin and the tan-colored side showing. Once firmly in place, wash your hands well with soap and water. Do not get this medicine into your eyes. After removing the patch,   wash your hands and the area behind your ear thoroughly with soap and water. The patch will still contain some medicine after use. To avoid accidental contact or ingestion by children or pets, fold the used patch in half with the sticky side together and throw away in the trash out of the reach of children and pets. If you need to use a second patch after you remove the first, place it behind the other ear. A special MedGuide will be given to you by the pharmacist with each prescription and refill. Be sure to read this information carefully each time. Talk to your pediatrician regarding the use of this medicine in children. Special care may be needed. Overdosage: If you think you have taken too much of this medicine contact a poison control center or emergency room at once. NOTE: This medicine is only for you. Do not share this medicine with others. What if I miss a dose? This does not apply. This medicine is not for regular use. What may interact with this medicine?  alcohol  antihistamines for allergy cough and cold  atropine  certain medicines for anxiety or sleep  certain medicines for bladder problems like oxybutynin, tolterodine  certain medicines for depression like amitriptyline, fluoxetine, sertraline  certain medicines for stomach problems like dicyclomine, hyoscyamine  certain medicines for Parkinson's disease like benztropine, trihexyphenidyl  certain medicines for seizures like phenobarbital, primidone  general anesthetics like halothane, isoflurane, methoxyflurane, propofol  ipratropium  local anesthetics like lidocaine, pramoxine, tetracaine  medicines that relax muscles for surgery  phenothiazines like  chlorpromazine, mesoridazine, prochlorperazine, thioridazine  narcotic medicines for pain  other belladonna alkaloids This list may not describe all possible interactions. Give your health care provider a list of all the medicines, herbs, non-prescription drugs, or dietary supplements you use. Also tell them if you smoke, drink alcohol, or use illegal drugs. Some items may interact with your medicine. What should I watch for while using this medicine? Limit contact with water while swimming and bathing because the patch may fall off. If the patch falls off, throw it away and put a new one behind the other ear. You may get drowsy or dizzy. Do not drive, use machinery, or do anything that needs mental alertness until you know how this medicine affects you. Do not stand or sit up quickly, especially if you are an older patient. This reduces the risk of dizzy or fainting spells. Alcohol may interfere with the effect of this medicine. Avoid alcoholic drinks. Your mouth may get dry. Chewing sugarless gum or sucking hard candy, and drinking plenty of water may help. Contact your healthcare professional if the problem does not go away or is severe. This medicine may cause dry eyes and blurred vision. If you wear contact lenses, you may feel some discomfort. Lubricating drops may help. See your healthcare professional if the problem does not go away or is severe. If you are going to need surgery, an MRI, CT scan, or other procedure, tell your healthcare professional that you are using this medicine. You may need to remove the patch before the procedure. What side effects may I notice from receiving this medicine? Side effects that you should report to your doctor or health care professional as soon as possible:  allergic reactions like skin rash, itching or hives; swelling of the face, lips, or tongue  blurred vision  changes in vision  confusion  dizziness  eye pain  fast, irregular  heartbeat  hallucinations, loss of contact with   reality  nausea, vomiting  pain or trouble passing urine  restlessness  seizures  skin irritation  stomach pain Side effects that usually do not require medical attention (report to your doctor or health care professional if they continue or are bothersome):  drowsiness  dry mouth  headache  sore throat This list may not describe all possible side effects. Call your doctor for medical advice about side effects. You may report side effects to FDA at 1-800-FDA-1088. Where should I keep my medicine? Keep out of the reach of children. Store at room temperature between 20 and 25 degrees C (68 and 77 degrees F). Keep this medicine in the foil package until ready to use. Throw away any unused medicine after the expiration date. NOTE: This sheet is a summary. It may not cover all possible information. If you have questions about this medicine, talk to your doctor, pharmacist, or health care provider.  2020 Elsevier/Gold Standard (2017-09-20 16:14:46)   

## 2020-07-19 NOTE — H&P (Signed)
Michelle Gordon, Michelle Gordon [id number] 03/11/1986  Date of Admission: @TODAY @ Admitting Physician:  Chief Complaint: Nasal obstruction, chronic sinusitis  HPI: This 35 y.o. year old adult with nasal obstruction and chronic sinusitis unresponsive to medical management with CT showing Bilateral maxillary and posterior ethmoid sinus disease and right septal deviation/inferior turbinate hypertrophy.   Medications:  Medications Prior to Admission  Medication Sig Dispense Refill  . diphenhydrAMINE (BENADRYL) 25 mg capsule Take 25 mg by mouth every 6 (six) hours as needed.    . etonogestrel-ethinyl estradiol (NUVARING) 0.12-0.015 MG/24HR vaginal ring INSERT 1 RING IN THE VAGINA FOR 3 WEEKS, THEM REMOVE FOR 1 WEEK 1 each 0  . sertraline (ZOLOFT) 50 MG tablet Take 1 tablet (50 mg total) by mouth 2 (two) times daily. (Patient taking differently: Take 100 mg by mouth daily.) 60 tablet 0  . SUMAtriptan (IMITREX) 25 MG tablet TAKE 1 TABLET BY MOUTH EVERY 2 HOURS AS NEEDED FOR MIGRAINE. MAY REPEAT IN 2 HOURS IF HEADACHE PERSISTS OR REOCCURS 10 tablet 0  . acetaminophen (TYLENOL) 500 MG tablet Take 500 mg by mouth every 6 (six) hours as needed.    . pseudoephedrine (SUDAFED) 120 MG 12 hr tablet Take 120 mg by mouth 2 (two) times daily.      Allergies:  Allergies  Allergen Reactions  . Doxycycline Rash  . Penicillins Rash    PMH:  Past Medical History:  Diagnosis Date  . Anemia   . Anxiety   . Headache    sinus  . Motion sickness    car passenger, boats    Fam Hx:  Family History  Problem Relation Age of Onset  . Hyperlipidemia Father   . Hypertension Father   . Hyperlipidemia Sister   . Hypertension Sister   . Cancer Maternal Grandmother        colon  . Heart disease Paternal Grandfather     Soc Hx:  Social History   Socioeconomic History  . Marital status: Married    Spouse name: Not on file  . Number of children: Not on file  . Years of education: Not on file  .  Highest education level: Not on file  Occupational History  . Not on file  Tobacco Use  . Smoking status: Former Smoker    Quit date: 2006    Years since quitting: 16.0  . Smokeless tobacco: Never Used  Vaping Use  . Vaping Use: Never used  Substance and Sexual Activity  . Alcohol use: Yes    Comment: occas (2-3 drinks/month)  . Drug use: No  . Sexual activity: Yes    Birth control/protection: Inserts  Other Topics Concern  . Not on file  Social History Narrative  . Not on file   Social Determinants of Health   Financial Resource Strain: Not on file  Food Insecurity: Not on file  Transportation Needs: Not on file  Physical Activity: Not on file  Stress: Not on file  Social Connections: Not on file  Intimate Partner Violence: Not on file    PSH:  Past Surgical History:  Procedure Laterality Date  . LAPAROSCOPY  04/2010   lost IUD  .   ROS: Negative for fever/cough. COVID test negative.  PHYSICAL EXAM  Vitals: Blood pressure 117/70, pulse 71, temperature 98.2 F (36.8 C), temperature source Temporal, height 5' 5.75" (1.67 m), weight 96.6 kg, last menstrual period 06/19/2020, SpO2 100 %.. General: Well-developed, Well-nourished in no acute distress Mood: Mood and affect well adjusted, pleasant and  cooperative. Orientation: Grossly alert and oriented. Vocal Quality: No hoarseness. Communicates verbally. head and Face: NCAT. No facial asymmetry. No visible skin lesions. No significant facial scars. No tenderness with sinus percussion. Facial strength normal and symmetric. Nasal exam: See prior H&P Respiratory: Normal respiratory effort without labored breathing. Lung CTA bilaterally Cardiovascular: Heart regular rate and rhythm  MEDICAL DECISION MAKING: Data Review:  Results for orders placed or performed during the hospital encounter of 07/19/20 (from the past 48 hour(s))  Pregnancy, urine POC     Status: None   Collection Time: 07/19/20  8:02 AM  Result Value  Ref Range   Preg Test, Ur NEGATIVE NEGATIVE    Comment:        THE SENSITIVITY OF THIS METHODOLOGY IS >24 mIU/mL   . No results found..   ASSESSMENT: Chronic maxillary and ethmoid sinusitis, septal deviation, inferior turbinate hypertrophy  PLAN: Nasal septoplasty, bilateral SMR of the inferior turbinates, Bilateral endoscopic maxillary antrostomies, bilateral total ethmoidectomies.    Sandi Mealy 07/19/2020 10:45 AM

## 2020-07-20 ENCOUNTER — Encounter: Payer: Self-pay | Admitting: Otolaryngology

## 2020-07-22 LAB — SURGICAL PATHOLOGY

## 2020-09-07 ENCOUNTER — Other Ambulatory Visit: Payer: Self-pay | Admitting: Surgery

## 2020-09-07 DIAGNOSIS — R1033 Periumbilical pain: Secondary | ICD-10-CM

## 2020-09-20 ENCOUNTER — Other Ambulatory Visit: Payer: Self-pay

## 2020-09-20 ENCOUNTER — Ambulatory Visit
Admission: RE | Admit: 2020-09-20 | Discharge: 2020-09-20 | Disposition: A | Discharge status: Home / Self Care | Payer: Federal, State, Local not specified - PPO | Source: Ambulatory Visit | Attending: Surgery | Admitting: Surgery

## 2020-09-20 DIAGNOSIS — R1033 Periumbilical pain: Secondary | ICD-10-CM | POA: Insufficient documentation

## 2021-04-13 ENCOUNTER — Encounter: Payer: Self-pay | Admitting: Obstetrics and Gynecology

## 2021-05-09 ENCOUNTER — Emergency Department

## 2021-05-09 DIAGNOSIS — Z87891 Personal history of nicotine dependence: Secondary | ICD-10-CM | POA: Insufficient documentation

## 2021-05-09 DIAGNOSIS — Z3A10 10 weeks gestation of pregnancy: Secondary | ICD-10-CM | POA: Diagnosis not present

## 2021-05-09 DIAGNOSIS — O468X1 Other antepartum hemorrhage, first trimester: Secondary | ICD-10-CM | POA: Diagnosis present

## 2021-05-09 DIAGNOSIS — N9489 Other specified conditions associated with female genital organs and menstrual cycle: Secondary | ICD-10-CM | POA: Diagnosis not present

## 2021-05-09 LAB — CBC WITH DIFFERENTIAL/PLATELET
Abs Immature Granulocytes: 0.02 10*3/uL (ref 0.00–0.07)
Basophils Absolute: 0 10*3/uL (ref 0.0–0.1)
Basophils Relative: 0 %
Eosinophils Absolute: 0.2 10*3/uL (ref 0.0–0.5)
Eosinophils Relative: 2 %
HCT: 32.9 % — ABNORMAL LOW (ref 36.0–46.0)
Hemoglobin: 12.1 g/dL (ref 12.0–15.0)
Immature Granulocytes: 0 %
Lymphocytes Relative: 30 %
Lymphs Abs: 2.8 10*3/uL (ref 0.7–4.0)
MCH: 30.9 pg (ref 26.0–34.0)
MCHC: 36.8 g/dL — ABNORMAL HIGH (ref 30.0–36.0)
MCV: 83.9 fL (ref 80.0–100.0)
Monocytes Absolute: 0.5 10*3/uL (ref 0.1–1.0)
Monocytes Relative: 5 %
Neutro Abs: 6 10*3/uL (ref 1.7–7.7)
Neutrophils Relative %: 63 %
Platelets: 403 10*3/uL — ABNORMAL HIGH (ref 150–400)
RBC: 3.92 MIL/uL (ref 3.87–5.11)
RDW: 13.9 % (ref 11.5–15.5)
WBC: 9.6 10*3/uL (ref 4.0–10.5)
nRBC: 0 % (ref 0.0–0.2)

## 2021-05-09 LAB — TYPE AND SCREEN
ABO/RH(D): A POS
Antibody Screen: NEGATIVE

## 2021-05-09 LAB — HCG, QUANTITATIVE, PREGNANCY: hCG, Beta Chain, Quant, S: 129374 m[IU]/mL — ABNORMAL HIGH (ref <5)

## 2021-05-09 NOTE — ED Notes (Signed)
Pt in flex wait

## 2021-05-09 NOTE — ED Triage Notes (Signed)
Pt reports she was at dinner this evening at 1830 when she began to have continuous vaginal bleeding with large clots that have not stopped since. Pt in triage in a pool of blood. Per pt she is approx [redacted] weeks pregnant and also has a confirmed left ovarian cyst, size of softball. Last OB appointment 4 weeks ago, due tomorrow. Pain reported all the way across lower abdomen into groin.

## 2021-05-10 ENCOUNTER — Emergency Department
Admission: EM | Admit: 2021-05-10 | Discharge: 2021-05-10 | Disposition: A | Discharge status: Home / Self Care | Attending: Emergency Medicine | Admitting: Emergency Medicine

## 2021-05-10 DIAGNOSIS — O418X10 Other specified disorders of amniotic fluid and membranes, first trimester, not applicable or unspecified: Secondary | ICD-10-CM

## 2021-05-10 NOTE — ED Provider Notes (Signed)
[facility] Emergency Department Provider Note  ____________________________________________  Time seen: Approximately 12:47 AM  I have reviewed the triage vital signs and the nursing notes.   HISTORY  Chief Complaint Vaginal Bleeding   HPI Michelle Gordon is a 35 y.o. adult G5P3A1 currently at approximately [redacted] weeks gestational age who presents for evaluation of vaginal bleeding.  Patient reports that she was at dinner this evening at 6:30 PM when she started having vaginal bleeding and passing large clots.  The bleeding has continue throughout her stay in the emergency room.  She is complaining of suprapubic abdominal cramping.  No vaginal discharge, no dysuria or hematuria, no trauma to her abdomen, no blood thinners, no history of bleeding disorders.   Past Medical History:  Diagnosis Date   Anemia    Anxiety    Headache    sinus   Motion sickness    car passenger, boats    Patient Active Problem List   Diagnosis Date Noted   Elevated cholesterol with elevated triglycerides 07/06/2015   Obese 07/05/2015    Past Surgical History:  Procedure Laterality Date   ETHMOIDECTOMY Bilateral 07/19/2020   Procedure: TOTAL ETHMOIDECTOMY;  Surgeon: Geanie Logan, MD;  Location: [facility];  Service: ENT;  Laterality: Bilateral;   IMAGE GUIDED SINUS SURGERY N/A 07/19/2020   Procedure: IMAGE GUIDED SINUS SURGERY;  Surgeon: Geanie Logan, MD;  Location: [facility];  Service: ENT;  Laterality: N/A;  placed disk on OR charge nurse desk 12-6 kp 2nd disk here  told Darl Pikes ds   LAPAROSCOPY  04/2010   lost IUD   MAXILLARY ANTROSTOMY Bilateral 07/19/2020   Procedure: MAXILLARY ANTROSTOMY WITH TISSUE REMOVAL;  Surgeon: Geanie Logan, MD;  Location: [facility];  Service: ENT;  Laterality: Bilateral;   NASAL SEPTOPLASTY W/ TURBINOPLASTY Bilateral 07/19/2020   Procedure: NASAL SEPTOPLASTY WITH SUBMUCOUS RESECTION OF TURBINATES;  Surgeon:  Geanie Logan, MD;  Location: [facility];  Service: ENT;  Laterality: Bilateral;    Prior to Admission medications   Medication Sig Start Date End Date Taking? Authorizing Provider  acetaminophen (TYLENOL) 500 MG tablet Take 500 mg by mouth every 6 (six) hours as needed.    [provider]  cefdinir (OMNICEF) 300 MG capsule Take 1 capsule (300 mg total) by mouth 2 (two) times daily. 07/19/20   Geanie Logan, MD  diphenhydrAMINE (BENADRYL) 25 mg capsule Take 25 mg by mouth every 6 (six) hours as needed.    [provider]  etonogestrel-ethinyl estradiol (NUVARING) 0.12-0.015 MG/24HR vaginal ring INSERT 1 RING IN THE VAGINA FOR 3 WEEKS, THEM REMOVE FOR 1 WEEK 04/26/20   Linzie Collin, MD  HYDROcodone-acetaminophen (NORCO/VICODIN) 5-325 MG tablet Take 1-2 tablets by mouth every 6 (six) hours as needed for moderate pain. 07/19/20   Geanie Logan, MD  pseudoephedrine (SUDAFED) 120 MG 12 hr tablet Take 120 mg by mouth 2 (two) times daily.    [provider]  sertraline (ZOLOFT) 50 MG tablet Take 1 tablet (50 mg total) by mouth 2 (two) times daily. Patient taking differently: Take 100 mg by mouth daily. 09/14/19   Doreene Burke, CNM  SUMAtriptan (IMITREX) 25 MG tablet TAKE 1 TABLET BY MOUTH EVERY 2 HOURS AS NEEDED FOR MIGRAINE. MAY REPEAT IN 2 HOURS IF HEADACHE PERSISTS OR REOCCURS 01/09/19   Shambley, Melody N, CNM    Allergies Doxycycline and Penicillins  Family History  Problem Relation Age of Onset   Hyperlipidemia Father    Hypertension  Father    Hyperlipidemia Sister    Hypertension Sister    Cancer Maternal Grandmother        colon   Heart disease Paternal Grandfather     Social History Social History   Tobacco Use   Smoking status: Former    Types: Cigarettes    Quit date: 2006    Years since quitting: 16.8   Smokeless tobacco: Never  Vaping Use   Vaping Use: Never used  Substance Use Topics   Alcohol use: Yes    Comment: occas (2-3  drinks/month)   Drug use: No    Review of Systems  Constitutional: Negative for fever. Eyes: Negative for visual changes. ENT: Negative for sore throat. Neck: No neck pain  Cardiovascular: Negative for chest pain. Respiratory: Negative for shortness of breath. Gastrointestinal: Negative for abdominal pain, vomiting or diarrhea. Genitourinary: Negative for dysuria. + vaginal bleeding Musculoskeletal: Negative for back pain. Skin: Negative for rash. Neurological: Negative for headaches, weakness or numbness. Psych: No SI or HI  ____________________________________________   PHYSICAL EXAM:  VITAL SIGNS: ED Triage Vitals [05/09/21 1958]  Enc Vitals Group     BP (!) 134/95     Pulse Rate 89     Resp 16     Temp 98.3 F (36.8 C)     Temp Source Oral     SpO2 98 %     Weight 207 lb (93.9 kg)     Height      Head Circumference      Peak Flow      Pain Score      Pain Loc      Pain Edu?      Excl. in GC?     Constitutional: Alert and oriented. Well appearing and in no apparent distress. HEENT:      Head: Normocephalic and atraumatic.         Eyes: Conjunctivae are normal. Sclera is non-icteric.       Mouth/Throat: Mucous membranes are moist.       Neck: Supple with no signs of meningismus. Cardiovascular: Regular rate and rhythm. No murmurs, gallops, or rubs. 2+ symmetrical distal pulses are present in all extremities. No JVD. Respiratory: Normal respiratory effort. Lungs are clear to auscultation bilaterally.  Gastrointestinal: Soft, non tender, and non distended with positive bowel sounds. No rebound or guarding. Genitourinary: No CVA tenderness. Musculoskeletal:  No edema, cyanosis, or erythema of extremities. Neurologic: Normal speech and language. Face is symmetric. Moving all extremities. No gross focal neurologic deficits are appreciated. Skin: Skin is warm, dry and intact. No rash noted. Psychiatric: Mood and affect are normal. Speech and behavior are  normal.  ____________________________________________   LABS (all labs ordered are listed, but only abnormal results are displayed)  Labs Reviewed  CBC WITH DIFFERENTIAL/PLATELET - Abnormal; Notable for the following components:      Result Value   HCT 32.9 (*)    MCHC 36.8 (*)    Platelets 403 (*)    All other components within normal limits  HCG, QUANTITATIVE, PREGNANCY - Abnormal; Notable for the following components:   hCG, Beta Chain, Mahalia Longest [phone],892 (*)    All other components within normal limits  TYPE AND SCREEN   ____________________________________________  EKG  none  ____________________________________________  RADIOLOGY  I have personally reviewed the images performed during this visit and I agree with the Radiologist's read.   Interpretation by Radiologist:  US OB Comp Less 14 Wks  Result Date: 05/09/2021 CLINICAL DATA:  Heavy vaginal bleeding EXAM: OBSTETRIC <14 WK ULTRASOUND TECHNIQUE: Transabdominal ultrasound was performed for evaluation of the gestation as well as the maternal uterus and adnexal regions. COMPARISON:  None. FINDINGS: Intrauterine gestational sac: Single Yolk sac:  Visualized. Embryo:  Visualized. Cardiac Activity: Visualized. Heart Rate: 155 bpm MSD:    Mm    w    d CRL:   3.6 mm   10 w 3 d                  [place] EDC: 12/02/2021 Subchorionic hemorrhage: Moderate right fundal subchorionic hemorrhage measuring 2.6 x 2.3 x 2.1 cm Maternal uterus/adnexae: 3.4 cm simple appearing right ovarian cyst. No free fluid or adnexal mass. IMPRESSION: Ten week 3 day intrauterine pregnancy. Fetal heart rate 155 beats per minute. Moderate subchorionic hemorrhage. Electronically Signed   By: Charlett Nose M.D.   On: 05/09/2021 21:06     ____________________________________________   PROCEDURES  Procedure(s) performed: None Procedures   Critical Care performed:  None ____________________________________________   INITIAL IMPRESSION / ASSESSMENT AND PLAN  / ED COURSE  35 y.o. adult G5P3A1 currently at approximately [redacted] weeks gestational age who presents for evaluation of vaginal bleeding.  Transvaginal ultrasound showing moderate subchorionic hemorrhage with a normal IUP with heart rate of 155.  Patient is hemodynamically stable, stable hemoglobin.  Blood type of O+ with no indication for RhoGAM.  No indication for transfusion.  We discussed pelvic rest and follow-up with OB for repeat ultrasound in a week.  History gathered from patient and her husband who was at bedside.  Plan discussed with both of them.  Old medical records reviewed including patient's last visit with OB/GYN dating 4 weeks ago.      _____________________________________________ Please note:  Patient was evaluated in Emergency Department today for the symptoms described in the history of present illness. Patient was evaluated in the context of the global COVID-19 pandemic, which necessitated consideration that the patient might be at risk for infection with the SARS-CoV-2 virus that causes COVID-19. Institutional protocols and algorithms that pertain to the evaluation of patients at risk for COVID-19 are in a state of rapid change based on information released by regulatory bodies including the CDC and federal and state organizations. These policies and algorithms were followed during the patient's care in the ED.  Some ED evaluations and interventions may be delayed as a result of limited staffing during the pandemic.   [place] Controlled Substance Database was reviewed by me. ____________________________________________   FINAL CLINICAL IMPRESSION(S) / ED DIAGNOSES   Final diagnoses:  Subchorionic hematoma in first trimester, single or unspecified fetus      NEW MEDICATIONS STARTED DURING THIS VISIT:  ED Discharge Orders     None        Note:  This document was prepared using Dragon voice recognition software and may include unintentional dictation errors.     Nita Sickle, MD 05/10/21 [id number]

## 2021-05-26 ENCOUNTER — Ambulatory Visit
Admission: RE | Admit: 2021-05-26 | Discharge: 2021-05-26 | Disposition: A | Discharge status: Home / Self Care | Source: Ambulatory Visit | Attending: Certified Nurse Midwife | Admitting: Certified Nurse Midwife

## 2021-05-26 ENCOUNTER — Other Ambulatory Visit: Payer: Self-pay | Admitting: Certified Nurse Midwife

## 2021-05-26 ENCOUNTER — Other Ambulatory Visit: Payer: Self-pay

## 2021-05-26 DIAGNOSIS — O209 Hemorrhage in early pregnancy, unspecified: Secondary | ICD-10-CM | POA: Diagnosis not present

## 2021-07-16 NOTE — L&D Delivery Note (Addendum)
Delivery Note ? ?First Stage: ?Labor onset: 0030 ?Augmentation: none ?Analgesia /Anesthesia intrapartum: none ?SROM at 0217 ? ?Second Stage: ?Complete dilation at 0252 ?Onset of pushing at 0252 ?FHR second stage Cat II- variable decels, intermittent tracing due to maternal positioning in hands and knees.  ? ?Delivery of a viable female infant on 11/16/21 at [facility] by CNM ?delivery of fetal head in ROA position with restitution to ROT. ?No nuchal cord;  Anterior then posterior shoulders delivered easily with gentle downward traction. Baby placed on mom's chest, and attended to by peds.  ?Cord double clamped after cessation of pulsation, cut by CNM ? ? ?Third Stage: ?Active 3rd stage mgmt did not result in spontaneous delivery of the placenta, discussed manual removal R/B/A, pt consented, Pt pre-medicated with fentanyl 193mcg IV. On exam, noted placenta partially delivered through cervix, grasped placenta with sterile gloved hand and gently pulled, which resulted in delivery of intact placenta.  ? ?Placenta disposition: routine disposal ?Uterine tone Firm / bleeding small ? ?No vaginal, cervical or perineal laceration identified  ?Anesthesia for repair: n/a ?Repair n;/a ?Est. Blood Loss (mL): 200 ? ?Complications: none ? ?Mom to postpartum.  Baby to Couplet care / Skin to Skin. ? ?Newborn: ?Birth Weight: 8#3  ?Apgar Scores: 8/9 ?Feeding planned: breast ? ? ? ?

## 2021-07-24 DIAGNOSIS — R002 Palpitations: Secondary | ICD-10-CM | POA: Insufficient documentation

## 2021-07-24 HISTORY — DX: Palpitations: R00.2

## 2021-10-17 ENCOUNTER — Other Ambulatory Visit: Payer: Self-pay | Admitting: Certified Nurse Midwife

## 2021-10-17 DIAGNOSIS — O99019 Anemia complicating pregnancy, unspecified trimester: Secondary | ICD-10-CM

## 2021-10-19 ENCOUNTER — Ambulatory Visit
Admission: RE | Admit: 2021-10-19 | Discharge: 2021-10-19 | Disposition: A | Discharge status: Home / Self Care | Source: Ambulatory Visit | Attending: Obstetrics and Gynecology | Admitting: Obstetrics and Gynecology

## 2021-10-19 DIAGNOSIS — O99019 Anemia complicating pregnancy, unspecified trimester: Secondary | ICD-10-CM | POA: Diagnosis not present

## 2021-10-19 MED ORDER — SODIUM CHLORIDE 0.9 % IV SOLN
300.0000 mg | INTRAVENOUS | Status: DC
  Administered 2021-10-19: 300 mg via INTRAVENOUS
  Filled 2021-10-19: qty 300

## 2021-10-19 NOTE — Progress Notes (Signed)
Patient received her first dose of IV iron sucrose today.  She tolerated it well.  VSS upon admission and at discharge.  Planned the next two visits for her.  IV was removed and patient ambulated out.   ?

## 2021-10-26 ENCOUNTER — Ambulatory Visit
Admission: RE | Admit: 2021-10-26 | Discharge: 2021-10-26 | Disposition: A | Discharge status: Home / Self Care | Source: Ambulatory Visit | Attending: Certified Nurse Midwife | Admitting: Certified Nurse Midwife

## 2021-10-26 DIAGNOSIS — O09529 Supervision of elderly multigravida, unspecified trimester: Secondary | ICD-10-CM | POA: Insufficient documentation

## 2021-10-26 DIAGNOSIS — D649 Anemia, unspecified: Secondary | ICD-10-CM | POA: Insufficient documentation

## 2021-10-26 DIAGNOSIS — Z3A00 Weeks of gestation of pregnancy not specified: Secondary | ICD-10-CM | POA: Insufficient documentation

## 2021-10-26 DIAGNOSIS — O99019 Anemia complicating pregnancy, unspecified trimester: Secondary | ICD-10-CM | POA: Insufficient documentation

## 2021-10-26 MED ORDER — SODIUM CHLORIDE FLUSH 0.9 % IV SOLN
INTRAVENOUS | Status: AC
Start: 2021-10-26 — End: 2021-10-26
  Filled 2021-10-26: qty 10

## 2021-10-26 MED ORDER — SODIUM CHLORIDE 0.9 % IV SOLN
300.0000 mg | INTRAVENOUS | Status: DC
  Administered 2021-10-26: 300 mg via INTRAVENOUS
  Filled 2021-10-26: qty 300

## 2021-11-02 ENCOUNTER — Ambulatory Visit
Admission: RE | Admit: 2021-11-02 | Discharge: 2021-11-02 | Disposition: A | Discharge status: Home / Self Care | Source: Ambulatory Visit | Attending: Certified Nurse Midwife | Admitting: Certified Nurse Midwife

## 2021-11-02 DIAGNOSIS — Z3A00 Weeks of gestation of pregnancy not specified: Secondary | ICD-10-CM | POA: Insufficient documentation

## 2021-11-02 DIAGNOSIS — O09529 Supervision of elderly multigravida, unspecified trimester: Secondary | ICD-10-CM | POA: Insufficient documentation

## 2021-11-02 DIAGNOSIS — O99019 Anemia complicating pregnancy, unspecified trimester: Secondary | ICD-10-CM | POA: Diagnosis not present

## 2021-11-02 DIAGNOSIS — D649 Anemia, unspecified: Secondary | ICD-10-CM | POA: Diagnosis not present

## 2021-11-02 MED ORDER — SODIUM CHLORIDE FLUSH 0.9 % IV SOLN
INTRAVENOUS | Status: AC
  Filled 2021-11-02: qty 20

## 2021-11-02 MED ORDER — IRON SUCROSE 20 MG/ML IV SOLN
300.0000 mg | Freq: Once | INTRAVENOUS | Status: AC
  Administered 2021-11-02: 300 mg via INTRAVENOUS
  Filled 2021-11-02: qty 300

## 2021-11-15 ENCOUNTER — Other Ambulatory Visit: Payer: Self-pay

## 2021-11-15 ENCOUNTER — Inpatient Hospital Stay
Admission: EM | Admit: 2021-11-15 | Discharge: 2021-11-17 | DRG: 807 | Disposition: A | Discharge status: Home / Self Care | Attending: Obstetrics and Gynecology | Admitting: Obstetrics and Gynecology

## 2021-11-15 ENCOUNTER — Encounter: Payer: Self-pay | Admitting: Obstetrics and Gynecology

## 2021-11-15 DIAGNOSIS — O99214 Obesity complicating childbirth: Secondary | ICD-10-CM | POA: Diagnosis present

## 2021-11-15 DIAGNOSIS — Z880 Allergy status to penicillin: Secondary | ICD-10-CM

## 2021-11-15 DIAGNOSIS — O99824 Streptococcus B carrier state complicating childbirth: Secondary | ICD-10-CM | POA: Diagnosis present

## 2021-11-15 DIAGNOSIS — O133 Gestational [pregnancy-induced] hypertension without significant proteinuria, third trimester: Secondary | ICD-10-CM | POA: Diagnosis present

## 2021-11-15 DIAGNOSIS — O134 Gestational [pregnancy-induced] hypertension without significant proteinuria, complicating childbirth: Principal | ICD-10-CM | POA: Diagnosis present

## 2021-11-15 DIAGNOSIS — O9902 Anemia complicating childbirth: Secondary | ICD-10-CM | POA: Diagnosis present

## 2021-11-15 DIAGNOSIS — Z3A37 37 weeks gestation of pregnancy: Secondary | ICD-10-CM

## 2021-11-15 DIAGNOSIS — D509 Iron deficiency anemia, unspecified: Secondary | ICD-10-CM | POA: Diagnosis present

## 2021-11-15 LAB — CBC
HCT: 32.9 % — ABNORMAL LOW (ref 36.0–46.0)
Hemoglobin: 11 g/dL — ABNORMAL LOW (ref 12.0–15.0)
MCH: 26.9 pg (ref 26.0–34.0)
MCHC: 33.4 g/dL (ref 30.0–36.0)
MCV: 80.4 fL (ref 80.0–100.0)
Platelets: 241 10*3/uL (ref 150–400)
RBC: 4.09 MIL/uL (ref 3.87–5.11)
RDW: 20.5 % — ABNORMAL HIGH (ref 11.5–15.5)
WBC: 8.6 10*3/uL (ref 4.0–10.5)
nRBC: 0 % (ref 0.0–0.2)

## 2021-11-15 MED ORDER — VANCOMYCIN HCL IN DEXTROSE 1-5 GM/200ML-% IV SOLN
1000.0000 mg | Freq: Two times a day (BID) | INTRAVENOUS | Status: DC
  Administered 2021-11-15: 1000 mg via INTRAVENOUS
  Filled 2021-11-15: qty 200

## 2021-11-15 MED ORDER — LACTATED RINGERS IV SOLN: INTRAVENOUS | Status: DC

## 2021-11-15 NOTE — OB Triage Note (Signed)
Pt is a 36yo G5P0013, 37w 1d. She arrived to the unit with complaints of contractions starting at 1830. She states that she has had bloody show, she reports positive fetal movement, and reports contractions every 5-28minutes. VS stable, monitors applied and assessing. ?  ?Initial FHT 135 at 2209.   ?

## 2021-11-16 ENCOUNTER — Encounter: Payer: Self-pay | Admitting: Obstetrics and Gynecology

## 2021-11-16 DIAGNOSIS — O26893 Other specified pregnancy related conditions, third trimester: Secondary | ICD-10-CM | POA: Diagnosis present

## 2021-11-16 DIAGNOSIS — Z3A37 37 weeks gestation of pregnancy: Secondary | ICD-10-CM | POA: Diagnosis not present

## 2021-11-16 DIAGNOSIS — O133 Gestational [pregnancy-induced] hypertension without significant proteinuria, third trimester: Secondary | ICD-10-CM | POA: Diagnosis present

## 2021-11-16 DIAGNOSIS — O134 Gestational [pregnancy-induced] hypertension without significant proteinuria, complicating childbirth: Secondary | ICD-10-CM | POA: Diagnosis present

## 2021-11-16 DIAGNOSIS — O99214 Obesity complicating childbirth: Secondary | ICD-10-CM | POA: Diagnosis present

## 2021-11-16 DIAGNOSIS — O9902 Anemia complicating childbirth: Secondary | ICD-10-CM | POA: Diagnosis present

## 2021-11-16 DIAGNOSIS — D509 Iron deficiency anemia, unspecified: Secondary | ICD-10-CM | POA: Diagnosis present

## 2021-11-16 DIAGNOSIS — O99824 Streptococcus B carrier state complicating childbirth: Secondary | ICD-10-CM | POA: Diagnosis present

## 2021-11-16 DIAGNOSIS — Z880 Allergy status to penicillin: Secondary | ICD-10-CM | POA: Diagnosis not present

## 2021-11-16 LAB — CBC
HCT: 31.5 % — ABNORMAL LOW (ref 36.0–46.0)
Hemoglobin: 10.4 g/dL — ABNORMAL LOW (ref 12.0–15.0)
MCH: 26.3 pg (ref 26.0–34.0)
MCHC: 33 g/dL (ref 30.0–36.0)
MCV: 79.7 fL — ABNORMAL LOW (ref 80.0–100.0)
Platelets: 233 10*3/uL (ref 150–400)
RBC: 3.95 MIL/uL (ref 3.87–5.11)
RDW: 20.1 % — ABNORMAL HIGH (ref 11.5–15.5)
WBC: 13.1 10*3/uL — ABNORMAL HIGH (ref 4.0–10.5)
nRBC: 0 % (ref 0.0–0.2)

## 2021-11-16 LAB — RPR: RPR Ser Ql: NONREACTIVE

## 2021-11-16 LAB — PROTEIN / CREATININE RATIO, URINE
Creatinine, Urine: 53 mg/dL
Protein Creatinine Ratio: 0.28 mg/mg{Cre} — ABNORMAL HIGH (ref 0.00–0.15)
Total Protein, Urine: 15 mg/dL

## 2021-11-16 LAB — TYPE AND SCREEN
ABO/RH(D): A POS
Antibody Screen: NEGATIVE

## 2021-11-16 LAB — COMPREHENSIVE METABOLIC PANEL
ALT: 12 U/L (ref 0–44)
AST: 24 U/L (ref 15–41)
Albumin: 3 g/dL — ABNORMAL LOW (ref 3.5–5.0)
Alkaline Phosphatase: 106 U/L (ref 38–126)
Anion gap: 5 (ref 5–15)
BUN: 9 mg/dL (ref 6–20)
CO2: 20 mmol/L — ABNORMAL LOW (ref 22–32)
Calcium: 8.9 mg/dL (ref 8.9–10.3)
Chloride: 109 mmol/L (ref 98–111)
Creatinine, Ser: 0.71 mg/dL (ref 0.44–1.00)
GFR, Estimated: >60 mL/min (ref >60)
Glucose, Bld: 90 mg/dL (ref 70–99)
Potassium: 4.1 mmol/L (ref 3.5–5.1)
Sodium: 134 mmol/L — ABNORMAL LOW (ref 135–145)
Total Bilirubin: 0.6 mg/dL (ref 0.3–1.2)
Total Protein: 6.7 g/dL (ref 6.5–8.1)

## 2021-11-16 MED ORDER — OXYTOCIN 10 UNIT/ML IJ SOLN
INTRAMUSCULAR | Status: DC
Start: 2021-11-16 — End: 2021-11-16
  Filled 2021-11-16: qty 2

## 2021-11-16 MED ORDER — LIDOCAINE HCL (PF) 1 % IJ SOLN
INTRAMUSCULAR | Status: AC
  Filled 2021-11-16: qty 30

## 2021-11-16 MED ORDER — LACTATED RINGERS IV SOLN: INTRAVENOUS | Status: DC

## 2021-11-16 MED ORDER — ZOLPIDEM TARTRATE 5 MG PO TABS: 5.0000 mg | ORAL_TABLET | Freq: Every evening | ORAL | Status: DC | PRN

## 2021-11-16 MED ORDER — SERTRALINE HCL 100 MG PO TABS
100.0000 mg | ORAL_TABLET | Freq: Every day | ORAL | Status: DC
  Filled 2021-11-16 (×3): qty 1

## 2021-11-16 MED ORDER — WITCH HAZEL-GLYCERIN EX PADS
1.0000 "application " | MEDICATED_PAD | CUTANEOUS | Status: DC | PRN
  Administered 2021-11-16: 1 via TOPICAL
  Filled 2021-11-16: qty 100

## 2021-11-16 MED ORDER — ONDANSETRON HCL 4 MG/2ML IJ SOLN: 4.0000 mg | INTRAMUSCULAR | Status: DC | PRN

## 2021-11-16 MED ORDER — AMMONIA AROMATIC IN INHA
RESPIRATORY_TRACT | Status: AC
  Filled 2021-11-16: qty 10

## 2021-11-16 MED ORDER — OXYTOCIN BOLUS FROM INFUSION
333.0000 mL | Freq: Once | INTRAVENOUS | Status: AC
  Administered 2021-11-16: 333 mL via INTRAVENOUS

## 2021-11-16 MED ORDER — SIMETHICONE 80 MG PO CHEW: 80.0000 mg | CHEWABLE_TABLET | ORAL | Status: DC | PRN

## 2021-11-16 MED ORDER — KETOROLAC TROMETHAMINE 30 MG/ML IJ SOLN
INTRAMUSCULAR | Status: AC
  Filled 2021-11-16: qty 1

## 2021-11-16 MED ORDER — DIBUCAINE (PERIANAL) 1 % EX OINT
1.0000 | TOPICAL_OINTMENT | CUTANEOUS | Status: DC | PRN
Start: 2021-11-16 — End: 2021-11-17

## 2021-11-16 MED ORDER — IBUPROFEN 600 MG PO TABS
600.0000 mg | ORAL_TABLET | Freq: Four times a day (QID) | ORAL | Status: DC
  Administered 2021-11-16 (×3): 600 mg via ORAL
  Filled 2021-11-16 (×3): qty 1

## 2021-11-16 MED ORDER — PRENATAL MULTIVITAMIN CH
1.0000 | ORAL_TABLET | Freq: Every day | ORAL | Status: DC
  Administered 2021-11-16: 1 via ORAL
  Filled 2021-11-16: qty 1

## 2021-11-16 MED ORDER — COCONUT OIL OIL
1.0000 | TOPICAL_OIL | Status: DC | PRN
Start: 2021-11-16 — End: 2021-11-17

## 2021-11-16 MED ORDER — TETANUS-DIPHTH-ACELL PERTUSSIS 5-2.5-18.5 LF-MCG/0.5 IM SUSY
0.5000 mL | PREFILLED_SYRINGE | Freq: Once | INTRAMUSCULAR | Status: DC
Start: 2021-11-17 — End: 2021-11-17
  Filled 2021-11-16: qty 0.5

## 2021-11-16 MED ORDER — FENTANYL CITRATE (PF) 100 MCG/2ML IJ SOLN
50.0000 ug | INTRAMUSCULAR | Status: DC | PRN
  Administered 2021-11-16: 100 ug via INTRAVENOUS
  Filled 2021-11-16: qty 2

## 2021-11-16 MED ORDER — LACTATED RINGERS IV SOLN: 500.0000 mL | INTRAVENOUS | Status: DC | PRN

## 2021-11-16 MED ORDER — ACETAMINOPHEN 325 MG PO TABS: 650.0000 mg | ORAL_TABLET | ORAL | Status: DC | PRN

## 2021-11-16 MED ORDER — OXYTOCIN-SODIUM CHLORIDE 30-0.9 UT/500ML-% IV SOLN
2.5000 [IU]/h | INTRAVENOUS | Status: DC
  Administered 2021-11-16: 2.5 [IU]/h via INTRAVENOUS
  Filled 2021-11-16: qty 500

## 2021-11-16 MED ORDER — SOD CITRATE-CITRIC ACID 500-334 MG/5ML PO SOLN: 30.0000 mL | ORAL | Status: DC | PRN

## 2021-11-16 MED ORDER — SENNOSIDES-DOCUSATE SODIUM 8.6-50 MG PO TABS
2.0000 | ORAL_TABLET | Freq: Every day | ORAL | Status: DC
  Filled 2021-11-16: qty 2

## 2021-11-16 MED ORDER — ACETAMINOPHEN 325 MG PO TABS
650.0000 mg | ORAL_TABLET | ORAL | Status: DC | PRN
  Administered 2021-11-16: 650 mg via ORAL
  Filled 2021-11-16: qty 2

## 2021-11-16 MED ORDER — ONDANSETRON HCL 4 MG PO TABS: 4.0000 mg | ORAL_TABLET | ORAL | Status: DC | PRN

## 2021-11-16 MED ORDER — MISOPROSTOL 200 MCG PO TABS
ORAL_TABLET | ORAL | Status: AC
  Filled 2021-11-16: qty 4

## 2021-11-16 MED ORDER — KETOROLAC TROMETHAMINE 30 MG/ML IJ SOLN
30.0000 mg | Freq: Once | INTRAMUSCULAR | Status: AC
  Administered 2021-11-16: 30 mg via INTRAVENOUS

## 2021-11-16 MED ORDER — LIDOCAINE HCL (PF) 1 % IJ SOLN: 30.0000 mL | INTRAMUSCULAR | Status: DC | PRN

## 2021-11-16 MED ORDER — ONDANSETRON HCL 4 MG/2ML IJ SOLN
4.0000 mg | Freq: Four times a day (QID) | INTRAMUSCULAR | Status: DC | PRN
  Administered 2021-11-16: 4 mg via INTRAVENOUS
  Filled 2021-11-16: qty 2

## 2021-11-16 MED ORDER — DIPHENHYDRAMINE HCL 25 MG PO CAPS
50.0000 mg | ORAL_CAPSULE | Freq: Four times a day (QID) | ORAL | Status: DC | PRN
  Administered 2021-11-16: 50 mg via ORAL
  Filled 2021-11-16: qty 2

## 2021-11-16 MED ORDER — DIPHENHYDRAMINE HCL 25 MG PO CAPS: 25.0000 mg | ORAL_CAPSULE | Freq: Four times a day (QID) | ORAL | Status: DC | PRN

## 2021-11-16 MED ORDER — BENZOCAINE-MENTHOL 20-0.5 % EX AERO
1.0000 "application " | INHALATION_SPRAY | CUTANEOUS | Status: DC | PRN
  Administered 2021-11-16: 1 via TOPICAL
  Filled 2021-11-16: qty 56

## 2021-11-16 NOTE — Discharge Summary (Signed)
Obstetrical Discharge Summary ? ?Patient Name: Michelle Gordon ?DOB: 1986/04/14 ?MRN: [id number] ? ?Date of Admission: 11/15/2021 ?Date of Delivery: 11/16/21 ?Delivered by: Michelle Gordon CNM ?Date of Discharge: 11/17/2021 ? ?Primary OB: Michelle Gordon  ?DJM:EQASTMH'D last menstrual period was 02/13/2021 (exact date). ?EDC Estimated Date of Delivery: 12/05/21 ?Gestational Age at Delivery: [redacted]w[redacted]d  ? ?Antepartum complications:  ?1. Resolved [facility] 1st trimester ?2. SOB and Tachycardia ?? 07/21/21: Holter monitor WNL, reviewed by Michelle Gordon, no need for f/u, risks of cardiac concerns in L&D <1% ?3. Iron deficiency Anemia: s/p PO supplementation then iron infusions.  ?4. Obesity BMI: 34.01 ?5. H/o mental health diagnoses, zoloft 100mg  daily ?6. Advanced Maternal Age, 35yo ? ?Admitting Diagnosis: Labor, GBS Pos, GHTN ?Secondary Diagnosis: SVD ? ?Patient Active Problem List  ? Diagnosis Date Noted  ? Gestational hypertension w/o significant proteinuria in 3rd trimester 11/16/2021  ? Labor and delivery, indication for care 11/15/2021  ? Elevated cholesterol with elevated triglycerides 07/06/2015  ? Obese 07/05/2015  ? ? ?Augmentation: N/A ?Complications: None ?Intrapartum complications/course: pt admitted with cervical change and mild range BP. Normal labs. Progressed spontaneously with SROM and then ready to push. See delivery note.  ?Date of Delivery: 11/16/21 ?Delivered By: 01/16/22 CNM ?Delivery Type: spontaneous vaginal delivery ?Anesthesia: none ?Placenta: manual ?Laceration: none ?Episiotomy: none ?Newborn Data: ?Live born female  ?Birth Weight: 8 lb 3.2 oz (3720 g) ?APGAR: 8, 9 ? ?Newborn Delivery   ?Birth date/time: 11/16/2021 02:56:00 ?Delivery type: Vaginal, Spontaneous ?  ?  ? ? ? ?Postpartum Procedures: none ?Edinburgh:  ? ?  11/16/2021  ?  9:36 AM  ?01/16/2022 Postnatal Depression Scale Screening Tool  ?I have been able to laugh and see the funny side of things. 0  ?I have looked forward with enjoyment to things. 0  ?I have  blamed myself unnecessarily when things went wrong. 1  ?I have been anxious or worried for no good reason. 0  ?I have felt scared or panicky for no good reason. 0  ?Things have been getting on top of me. 0  ?I have been so unhappy that I have had difficulty sleeping. 0  ?I have felt sad or miserable. 0  ?I have been so unhappy that I have been crying. 0  ?The thought of harming myself has occurred to me. 0  ?Edinburgh Postnatal Depression Scale Total 1  ?  ? ? ?Post partum course:  ?Patient had an uncomplicated postpartum course.  By time of discharge on PPD#1, her pain was controlled on oral pain medications; she had appropriate lochia and was ambulating, voiding without difficulty and tolerating regular diet.  She was deemed stable for discharge to home.   ? ? ?Discharge Physical Exam:  ?BP 121/84 (BP Location: Right Arm)   Pulse 76   Temp 98.5 ?F (36.9 ?C)   Resp 18   Ht 5\' 5"  (1.651 m)   Wt 110 kg   LMP 02/13/2021 (Exact Date)   SpO2 99%   Breastfeeding Unknown   BMI 40.36 kg/m?  ? ?General: NAD ?CV: RRR ?Pulm: CTABL, nl effort ?ABD: s/nd/nt, fundus firm and below the umbilicus ?Lochia: moderate ?Perineum: well approximated/intact ?DVT Evaluation: LE non-ttp, no evidence of DVT on exam. ? ?Hemoglobin  ?Date Value Ref Range Status  ?11/16/2021 10.4 (L) 12.0 - 15.0 g/dL Final  ? ?HGB  ?Date Value Ref Range Status  ?09/21/2012 10.2 (L) 12.0 - 16.0 g/dL Final  ? ?HCT  ?Date Value Ref Range Status  ?11/16/2021 31.5 (L) 36.0 -  46.0 % Final  ?09/22/2012 27.7 (L) 35.0 - 47.0 % Final  ? ? ? ?Disposition: stable, discharge to home. ?Baby Feeding: breastmilk ?Baby Disposition: home with mom ? ?Rh Immune globulin given: n/a ?Rubella vaccine given: immune ?Varicella vaccine given: immune ?Tdap vaccine given in AP or PP setting: 09/07/21 ?Flu vaccine given in AP or PP setting: due in season ? ?Contraception: partner vasectomy ? ?Prenatal Labs:  ?Blood type/Rh A Pos  ?Antibody screen neg  ?Rubella Immune  ?Varicella  Immune  ?RPR NR  ?HBsAg Neg  ?HIV NR  ?GC neg  ?Chlamydia neg  ?Genetic screening Negative, female  ?1 hour GTT  161  ?3 hour GTT  73, 141, 156, 131  ?GBS  Pos, clinda resistant. Allergy to PCN   ? ? ? ?Plan:  ?Michelle Gordon was discharged to home in good condition. ?Follow-up appointment with delivering provider in 6 weeks. ? ?Discharge Medications: ?Allergies as of 11/17/2021   ? ?   Reactions  ? Doxycycline Rash  ? Penicillins Rash  ? ?  ? ?  ?Medication List  ?  ? ?STOP taking these medications   ? ?etonogestrel-ethinyl estradiol 0.12-0.015 MG/24HR vaginal ring ?Commonly known as: NUVARING ?  ? ?  ? ?TAKE these medications   ? ?acetaminophen 325 MG tablet ?Commonly known as: Tylenol ?Take 2 tablets (650 mg total) by mouth every 4 (four) hours as needed (for pain scale < 4). ?What changed:  ?medication strength ?how much to take ?when to take this ?reasons to take this ?  ?benzocaine-Menthol 20-0.5 % Aero ?Commonly known as: DERMOPLAST ?Apply 1 application. topically as needed for irritation (perineal discomfort). ?  ?coconut oil Oil ?Apply 1 application. topically as needed. ?  ?ibuprofen 600 MG tablet ?Commonly known as: ADVIL ?Take 1 tablet (600 mg total) by mouth every 6 (six) hours. ?  ?prenatal multivitamin Tabs tablet ?Take 1 tablet by mouth daily at 12 noon. ?  ?senna-docusate 8.6-50 MG tablet ?Commonly known as: Senokot-S ?Take 2 tablets by mouth daily. ?  ?sertraline 100 MG tablet ?Commonly known as: ZOLOFT ?Take 1 tablet (100 mg total) by mouth daily. ?What changed:  ?medication strength ?how much to take ?when to take this ?  ?simethicone 80 MG chewable tablet ?Commonly known as: MYLICON ?Chew 1 tablet (80 mg total) by mouth as needed for flatulence. ?  ?SUMAtriptan 25 MG tablet ?Commonly known as: IMITREX ?TAKE 1 TABLET BY MOUTH EVERY 2 HOURS AS NEEDED FOR MIGRAINE. MAY REPEAT IN 2 HOURS IF HEADACHE PERSISTS OR REOCCURS ?  ?witch hazel-glycerin pad ?Commonly known as: TUCKS ?Apply 1 application.  topically as needed for hemorrhoids. ?  ? ?  ? ? ? Follow-up Information   ? ? Kattia Selley A, CNM Follow up in 6 week(s).   ?Specialty: Obstetrics and Gynecology ?Why: routine PP appointment ?Contact information: ?[address] ?[place] [address] ?[phone] ? ? ?  ?  ? ? [facility] Follow up in 3 day(s).   ?Why: BP check with any CNM ?Contact information: ?[address] ?[place] [address] ?[phone] ? ?  ?  ? ?  ?  ? ?  ? ? ?Signed: ? ?Randa Ngo, CNM ?11/17/2021  ?9:22 AM ? ? ? ?

## 2021-11-16 NOTE — Lactation Note (Signed)
This note was copied from a baby's chart. ?Lactation Consultation Note ? ?Patient Name: Boy Arlene Brickel ?Today's Date: 11/16/2021 ?Reason for consult: Initial assessment;Early term 37-38.6wks ?Age:37 hours ? ?Maternal Data ? ?This is mom's 4th baby, and first early term birth.Per mom's report baby has been latching and breastfeeding well. Mom declines breastfeeding assistance at this time and feels comfortable with how baby is breastfeeding. Discussed with mom breastfeeding behaviors for some babies born early term and potential for inconsistent feeding. Mom has a DEBP for home use. ? ?Has patient been taught Hand Expression?: Yes ?Does the patient have breastfeeding experience prior to this delivery?: Yes ?How long did the patient breastfeed?:  (Mom breastfeed her other children for varying lengths of time with at least 4 or more months of exclusive breastfeeding.) ? ?Feeding ?Mother's Current Feeding Choice: Breast Milk ? ?Interventions ?Interventions:  (Reviewed early term breastfeeding behaviors.) ? ?Discharge ?Pump: Personal (Mom has 2 electric breastpumps available for home use.) ? ?Consult Status ?Consult Status: PRN ? ?Update provided to care nurse. ? ?Fuller Song ?11/16/2021, 4:43 PM ? ? ? ?

## 2021-11-16 NOTE — H&P (Signed)
OB History & Physical  ? ?History of Present Illness:  ?Chief Complaint: painful UCs ? ?HPI:  ?Michelle Gordon is a 36 y.o. [id number] female at [redacted]w[redacted]d dated by [place] at [redacted]w[redacted]d, EDD 12/05/21.  She presents to L&D for painful UCs that started at 1830 this evening, s/p membrane sweep in office on 11/15/21. Reports active FM, UCs started around 1830 and progressed to every . Denies LOF, having some dark brown bloody show.  ?  ? ?Pregnancy Issues: ?1. Resolved [facility] 1st trimester ?2. SOB and Tachycardia ?? 07/21/21: Holter monitor WNL, reviewed by Dr. Gwen Pounds, no need for f/u, risks of cardiac concerns in L&D <1% ?3. Iron deficiency Anemia: s/p PO supplementation then iron infusions.  ?4. Obesity BMI: 34.01 ?5. H/o mental health diagnoses, zoloft 100mg  daily ?6. Advanced Maternal Age, 35yo ? ? ?Maternal Medical History:  ? ?Past Medical History:  ?Diagnosis Date  ? Anemia   ? Anxiety   ? Headache   ? sinus  ? Motion sickness   ? car passenger, boats  ? ? ?Past Surgical History:  ?Procedure Laterality Date  ? ETHMOIDECTOMY Bilateral 07/19/2020  ? Procedure: TOTAL ETHMOIDECTOMY;  Surgeon: 09/16/2020, MD;  Location: [facility];  Service: ENT;  Laterality: Bilateral;  ? IMAGE GUIDED SINUS SURGERY N/A 07/19/2020  ? Procedure: IMAGE GUIDED SINUS SURGERY;  Surgeon: 09/16/2020, MD;  Location: [facility];  Service: ENT;  Laterality: N/A;  placed disk on OR charge nurse desk 12-6 kp ?2nd disk here  told [facility] ds  ? LAPAROSCOPY  04/2010  ? lost IUD  ? MAXILLARY ANTROSTOMY Bilateral 07/19/2020  ? Procedure: MAXILLARY ANTROSTOMY WITH TISSUE REMOVAL;  Surgeon: 09/16/2020, MD;  Location: [facility];  Service: ENT;  Laterality: Bilateral;  ? NASAL SEPTOPLASTY W/ TURBINOPLASTY Bilateral 07/19/2020  ? Procedure: NASAL SEPTOPLASTY WITH SUBMUCOUS RESECTION OF TURBINATES;  Surgeon: 09/16/2020, MD;  Location: [facility];  Service: ENT;  Laterality: Bilateral;  ? ? ?Allergies  ?Allergen Reactions  ?  Doxycycline Rash  ? Penicillins Rash  ? ? ?Prior to Admission medications   ?Medication Sig Start Date End Date Taking? Authorizing Provider  ?acetaminophen (TYLENOL) 500 MG tablet Take 500 mg by mouth every 6 (six) hours as needed.   Yes [provider]  ?sertraline (ZOLOFT) 50 MG tablet Take 1 tablet (50 mg total) by mouth 2 (two) times daily. ?Patient taking differently: Take 100 mg by mouth daily. 09/14/19  Yes 11/14/19, CNM  ?cefdinir (OMNICEF) 300 MG capsule Take 1 capsule (300 mg total) by mouth 2 (two) times daily. ?Patient not taking: Reported on 10/19/2021 07/19/20   09/16/20, MD  ?diphenhydrAMINE (BENADRYL) 25 mg capsule Take 25 mg by mouth every 6 (six) hours as needed. ?Patient not taking: Reported on 11/15/2021    [provider]  ?etonogestrel-ethinyl estradiol (NUVARING) 0.12-0.015 MG/24HR vaginal ring INSERT 1 RING IN THE VAGINA FOR 3 WEEKS, THEM REMOVE FOR 1 WEEK 04/26/20   06/26/20, MD  ?HYDROcodone-acetaminophen (NORCO/VICODIN) 5-325 MG tablet Take 1-2 tablets by mouth every 6 (six) hours as needed for moderate pain. ?Patient not taking: Reported on 10/19/2021 07/19/20   09/16/20, MD  ?pseudoephedrine (SUDAFED) 120 MG 12 hr tablet Take 120 mg by mouth 2 (two) times daily. ?Patient not taking: Reported on 11/15/2021    [provider]  ?SUMAtriptan (IMITREX) 25 MG tablet TAKE 1 TABLET BY MOUTH EVERY 2 HOURS AS NEEDED FOR MIGRAINE. MAY REPEAT IN 2 HOURS IF HEADACHE PERSISTS  OR REOCCURS 01/09/19   Purcell NailsShambley, Melody N, CNM  ? ? ? ?[facility]  ? ?Social History: She  reports that she quit smoking about 17 years ago. Her smoking use included cigarettes. She has never used smokeless tobacco. She reports current alcohol use. She reports that she does not use drugs. ? ?Family History: family history includes Cancer in her maternal grandmother; Heart disease in her paternal grandfather; Hyperlipidemia in her father and sister;  Hypertension in her father and sister.  ? ?Review of Systems: A full review of systems was performed and negative except as noted in the HPI.   ? ? ?Physical Exam:  ?Vital Signs: BP 136/85   Pulse 64   Temp 98 ?F (36.7 ?C) (Oral)   Resp 16   LMP 02/13/2021 (Exact Date)  ? ?Vitals:  ? 11/15/21 2222 11/15/21 2230 11/15/21 2353  ?BP: (!) 140/92 (!) 143/85 136/85  ? ? ?General: no acute distress.  ?HEENT: normocephalic, atraumatic ?Heart: regular rate & rhythm.  No murmurs/rubs/gallops ?Lungs: clear to auscultation bilaterally, normal respiratory effort ?Abdomen: soft, gravid, non-tender;  EFW: 8.5lbs ?Pelvic:  ? External: Normal external female genitalia ? Cervix: 4/75/-2, soft, posterior.  ?  ?Extremities: non-tender, symmetric, no edema bilaterally.  DTRs: 2+  ?Neurologic: Alert & oriented x 3.   ? ?Results for orders placed or performed during the hospital encounter of 11/15/21 (from the past 24 hour(s))  ?Protein / creatinine ratio, urine     Status: Abnormal  ? Collection Time: 11/15/21 10:56 PM  ?Result Value Ref Range  ? Creatinine, Urine 53 mg/dL  ? Total Protein, Urine 15 mg/dL  ? Protein Creatinine Ratio 0.28 (H) 0.00 - 0.15 mg/mg[Cre]  ?Comprehensive metabolic panel     Status: Abnormal  ? Collection Time: 11/15/21 11:20 PM  ?Result Value Ref Range  ? Sodium 134 (L) 135 - 145 mmol/L  ? Potassium 4.1 3.5 - 5.1 mmol/L  ? Chloride 109 98 - 111 mmol/L  ? CO2 20 (L) 22 - 32 mmol/L  ? Glucose, Bld 90 70 - 99 mg/dL  ? BUN 9 6 - 20 mg/dL  ? Creatinine, Ser 0.71 0.44 - 1.00 mg/dL  ? Calcium 8.9 8.9 - 10.3 mg/dL  ? Total Protein 6.7 6.5 - 8.1 g/dL  ? Albumin 3.0 (L) 3.5 - 5.0 g/dL  ? AST 24 15 - 41 U/L  ? ALT 12 0 - 44 U/L  ? Alkaline Phosphatase 106 38 - 126 U/L  ? Total Bilirubin 0.6 0.3 - 1.2 mg/dL  ? GFR, Estimated >60 >60 mL/min  ? Anion gap 5 5 - 15  ?CBC     Status: Abnormal  ? Collection Time: 11/15/21 11:20 PM  ?Result Value Ref Range  ? WBC 8.6 4.0 - 10.5 K/uL  ? RBC 4.09 3.87 - 5.11 MIL/uL  ? Hemoglobin  11.0 (L) 12.0 - 15.0 g/dL  ? HCT 32.9 (L) 36.0 - 46.0 %  ? MCV 80.4 80.0 - 100.0 fL  ? MCH 26.9 26.0 - 34.0 pg  ? MCHC 33.4 30.0 - 36.0 g/dL  ? RDW 20.5 (H) 11.5 - 15.5 %  ? Platelets 241 150 - 400 K/uL  ? nRBC 0.0 0.0 - 0.2 %  ? ? ?Pertinent Results:  ?Prenatal Labs: ?Blood type/Rh A Pos  ?Antibody screen neg  ?Rubella Immune  ?Varicella Immune  ?RPR NR  ?HBsAg Neg  ?HIV NR  ?GC neg  ?Chlamydia neg  ?Genetic screening Negative, female  ?1 hour GTT  161  ?3 hour GTT  73, 141, 156, 131  ?GBS  Pos, clinda resistant. Allergy to PCN   ? ?FHT: 135bpm, mod variability, + accels, no decels ?TOCO: q1.5-2.25min, palp mod.  ?SVE:  Dilation: 4 / Effacement (%): 70, 80 / Station: -2  ?  ?Cephalic by leopolds and SVE ? ?No results found. ? ?Assessment:  ?Michelle Gordon is a 36 y.o. [id number] female at [redacted]w[redacted]d with GHTN and early labor.  ? ?Plan:  ?1. Admit to Labor & Delivery; consents reviewed and obtained ?- mild range BP on admit x 2, normal labs obtained, will continue to monitor.  ? ?2. Fetal Well being  ?- Fetal Tracing: Cat I ?- Group B Streptococcus ppx indicated: POS- PCN allergic, Clinda resistant, Vanc ordered.  ?- Presentation: cephalic confirmed by exam   ? ?3. Routine OB: ?- Prenatal labs reviewed, as above ?- Rh A Pos ?- CBC, T&S, RPR on admit ?- Clear fluids, IVF ? ?4. Monitoring of Labor ?-  Contractions: external toco in place ?-  Pelvis proven to 9#10 ?-  Plan for augmentation with AROM in active labor ?-  Plan for continuous fetal monitoring  ?-  Maternal pain control as desired ?- Anticipate vaginal delivery ? ?5. Post Partum Planning: ?- Infant feeding: breast ?- Tdap 09/07/21 ? ? ?Randa Ngo, CNM ?11/16/21 ?12:33 AM ? ? ? ? ?

## 2021-11-16 NOTE — Progress Notes (Signed)
Post Partum Day 0 ?Subjective: ?Doing well, no complaints.  Tolerating regular diet, pain with PO meds, voiding and ambulating without difficulty. ? ?No CP SOB Fever,Chills, N/V or leg pain; denies nipple or breast pain, no HA change of vision, RUQ/epigastric pain ? ?Objective: ?BP 114/79 (BP Location: Right Arm)   Pulse 82   Temp 97.9 ?F (36.6 ?C) (Oral)   Resp 20   Ht 5\' 5"  (1.651 m)   Wt 110 kg   LMP 02/13/2021 (Exact Date)   SpO2 97%   Breastfeeding Unknown   BMI 40.36 kg/m?  ?  ?Physical Exam:  ?General: NAD ?Breasts: soft/nontender ?CV: RRR ?Pulm: nl effort, CTABL ?Abdomen: soft, NT, BS x 4 ?Perineum: intact ?Lochia: moderate ?Uterine Fundus: fundus firm and 1 fb below umbilicus ?DVT Evaluation: no cords, ttp LEs  ? ?Recent Labs  ?  11/15/21 ?2320 11/16/21 ?0610  ?HGB 11.0* 10.4*  ?HCT 32.9* 31.5*  ?WBC 8.6 13.1*  ?PLT 241 233  ? ? ?Assessment/Plan: ?36 y.o. G5P1014 postpartum day # 0 ? ?- Continue routine PP care ?- Lactation consult ?- Discussed contraceptive options including implant, IUDs hormonal and non-hormonal, injection, pills/ring/patch, condoms, and NFP.  ?- Acute blood loss anemia - hemodynamically stable and asymptomatic; start po ferrous sulfate BID with stool softeners  ? ?Disposition: Does not desire Dc home today.  ? ? ? ?31, CNM ?11/16/2021 ?9:17 AM ? ? ? ? ?

## 2021-11-17 MED ORDER — IBUPROFEN 600 MG PO TABS: 600.0000 mg | ORAL_TABLET | Freq: Four times a day (QID) | ORAL | 0 refills | Status: DC

## 2021-11-17 MED ORDER — PRENATAL MULTIVITAMIN CH: 1.0000 | ORAL_TABLET | Freq: Every day | ORAL | Status: DC

## 2021-11-17 MED ORDER — SIMETHICONE 80 MG PO CHEW
80.0000 mg | CHEWABLE_TABLET | ORAL | 0 refills | Status: DC | PRN
Start: 2021-11-17 — End: 2023-09-12

## 2021-11-17 MED ORDER — COCONUT OIL OIL: 1.0000 "application " | TOPICAL_OIL | 0 refills | Status: DC | PRN

## 2021-11-17 MED ORDER — BENZOCAINE-MENTHOL 20-0.5 % EX AERO: 1.0000 "application " | INHALATION_SPRAY | CUTANEOUS | Status: DC | PRN

## 2021-11-17 MED ORDER — ACETAMINOPHEN 325 MG PO TABS: 650.0000 mg | ORAL_TABLET | ORAL | Status: DC | PRN

## 2021-11-17 MED ORDER — SENNOSIDES-DOCUSATE SODIUM 8.6-50 MG PO TABS
2.0000 | ORAL_TABLET | Freq: Every day | ORAL | 0 refills | Status: DC
Start: 2021-11-17 — End: 2023-09-12

## 2021-11-17 MED ORDER — SERTRALINE HCL 100 MG PO TABS: 100.0000 mg | ORAL_TABLET | Freq: Every day | ORAL | 0 refills | Status: DC

## 2021-11-17 MED ORDER — WITCH HAZEL-GLYCERIN EX PADS: 1.0000 "application " | MEDICATED_PAD | CUTANEOUS | 12 refills | Status: DC | PRN

## 2021-11-17 NOTE — Progress Notes (Signed)
Mother discharged. Discharge instructions given. Mother verbalizes understanding. Transported by axillary.  

## 2022-09-17 DIAGNOSIS — Z6834 Body mass index (BMI) 34.0-34.9, adult: Secondary | ICD-10-CM | POA: Diagnosis not present

## 2022-09-17 DIAGNOSIS — M778 Other enthesopathies, not elsewhere classified: Secondary | ICD-10-CM | POA: Diagnosis not present

## 2022-09-27 DIAGNOSIS — M25521 Pain in right elbow: Secondary | ICD-10-CM | POA: Diagnosis not present

## 2022-10-05 DIAGNOSIS — S8002XA Contusion of left knee, initial encounter: Secondary | ICD-10-CM | POA: Diagnosis not present

## 2022-10-17 DIAGNOSIS — S8002XA Contusion of left knee, initial encounter: Secondary | ICD-10-CM | POA: Diagnosis not present

## 2022-11-08 DIAGNOSIS — M25521 Pain in right elbow: Secondary | ICD-10-CM | POA: Diagnosis not present

## 2023-02-15 DIAGNOSIS — Z0000 Encounter for general adult medical examination without abnormal findings: Secondary | ICD-10-CM | POA: Diagnosis not present

## 2023-02-15 DIAGNOSIS — Z6834 Body mass index (BMI) 34.0-34.9, adult: Secondary | ICD-10-CM | POA: Diagnosis not present

## 2023-03-05 DIAGNOSIS — M6208 Separation of muscle (nontraumatic), other site: Secondary | ICD-10-CM | POA: Diagnosis not present

## 2023-03-05 DIAGNOSIS — K429 Umbilical hernia without obstruction or gangrene: Secondary | ICD-10-CM | POA: Diagnosis not present

## 2023-03-09 IMAGING — US US OB COMP LESS 14 WK
1 series · 14 of 28 positions shown · non-contrast
Comparison: 05/09/2021

CLINICAL DATA: Bleeding and first trimester of pregnancy, LMP
02/28/2021, EDC 12/05/2021, age 12 weeks 3 days

EXAM:
OBSTETRIC <14 WK ULTRASOUND
TECHNIQUE: Transabdominal ultrasound was performed for evaluation of the
gestation as well as the maternal uterus and adnexal regions.

[Series 1: us ob comp less 14 wk · 0.20mm/px · 60 acquisitions, 14 frames shown]
[im 3/60]
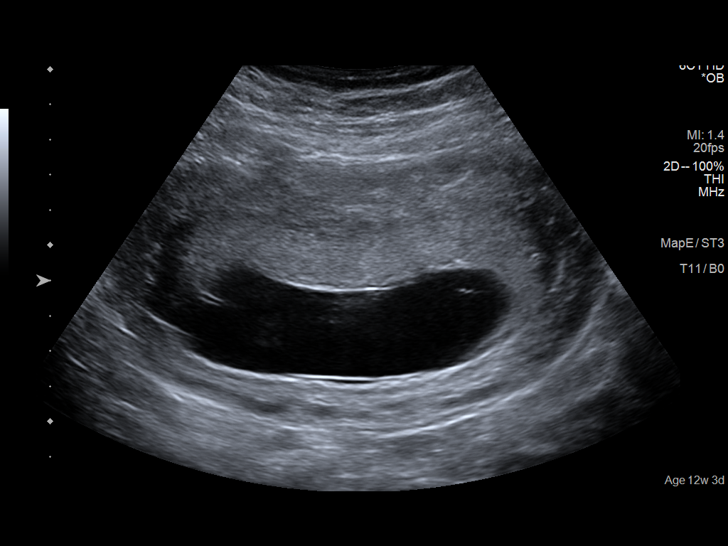
[im 7/60]
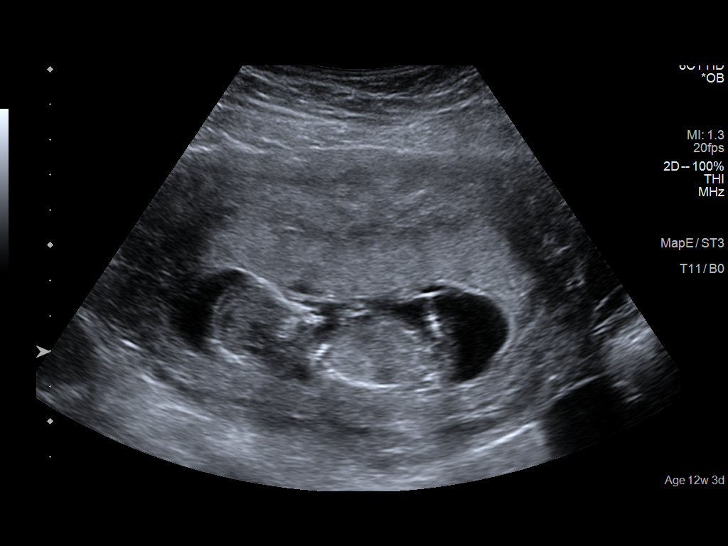
[im 11/60]
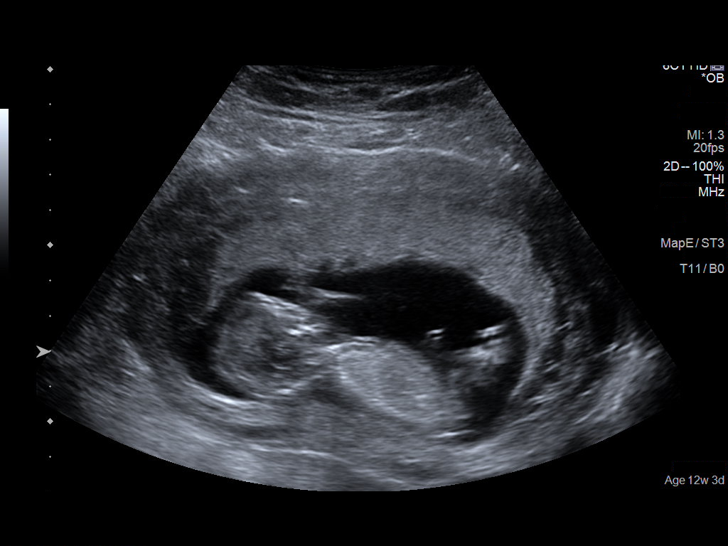
[im 16/60]
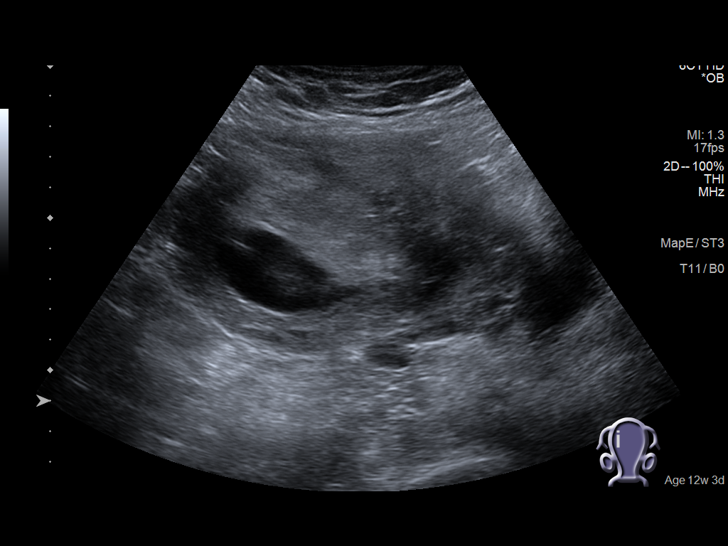
[im 20/60]
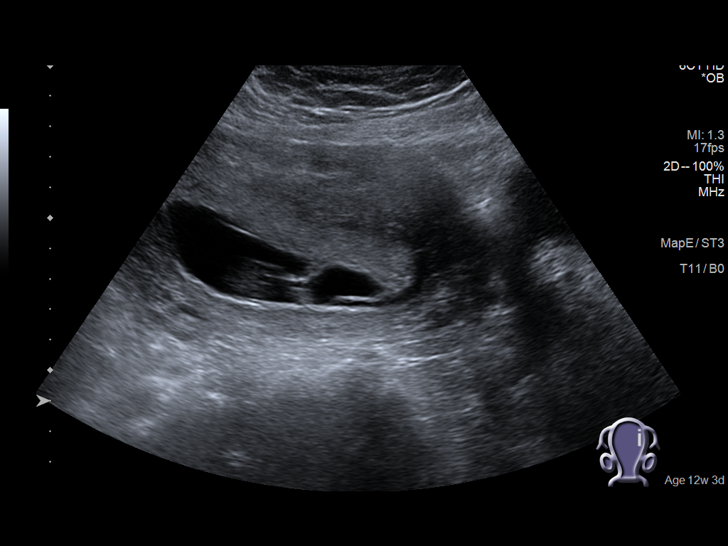
[im 25/60]
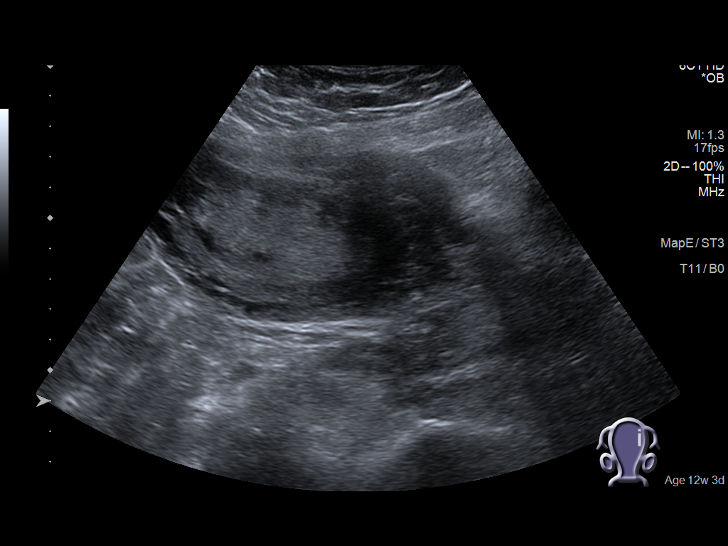
[im 29/60]
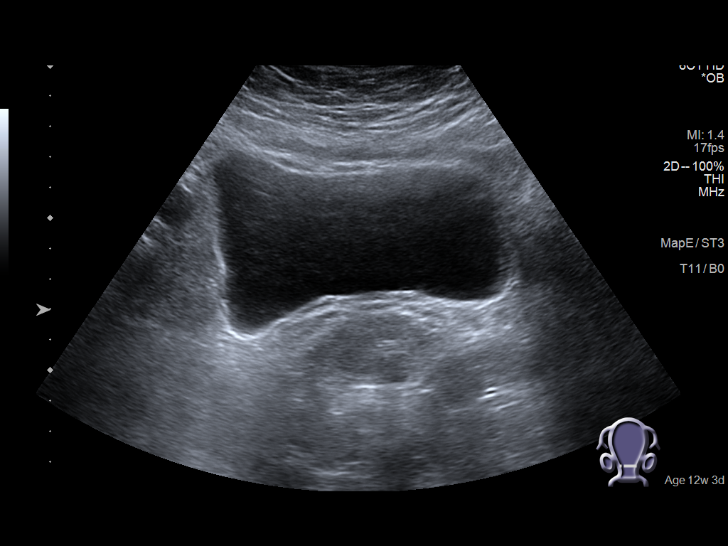
[im 33/60]
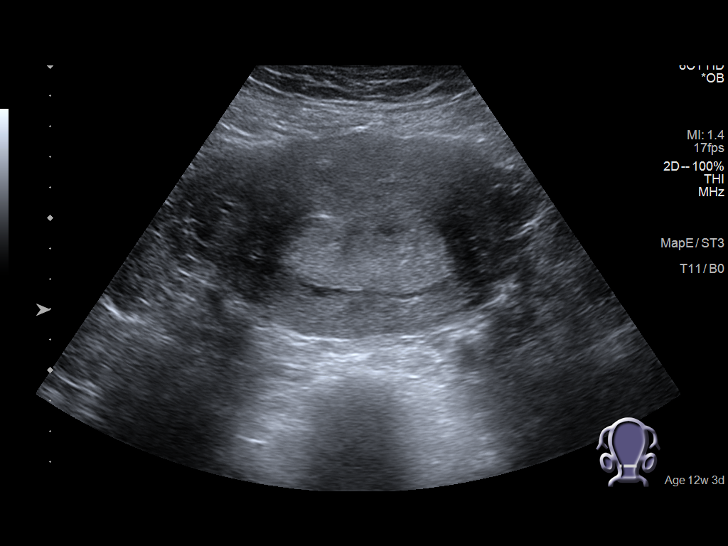
[im 38/60]
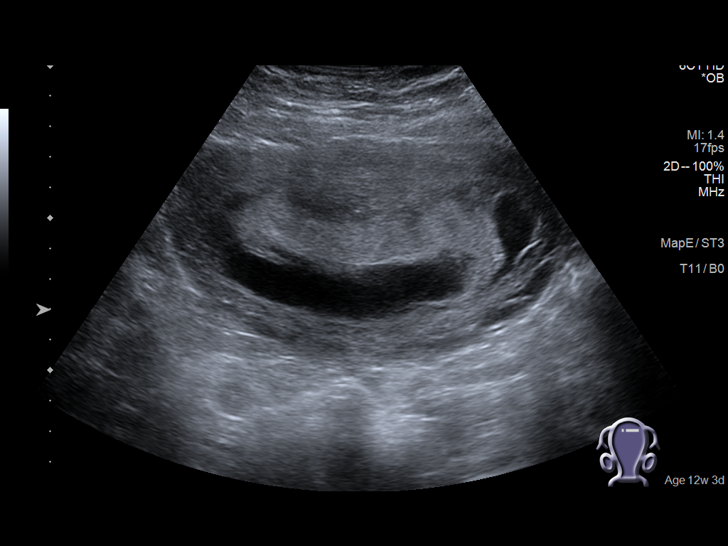
[im 42/60]
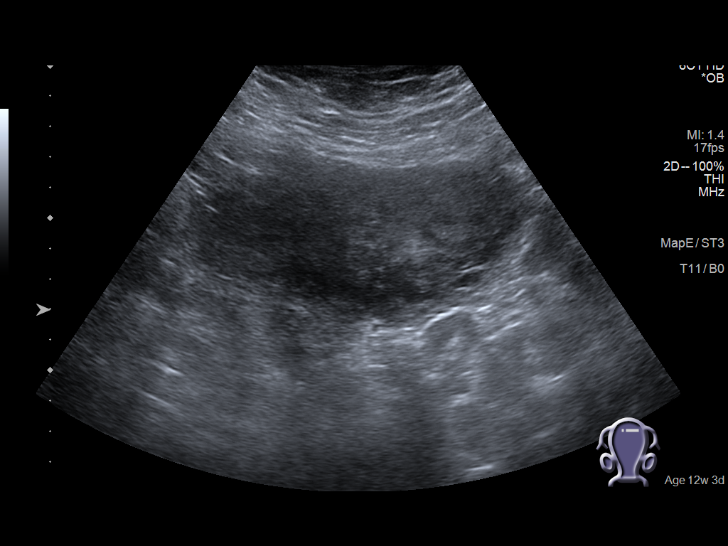
[im 46/60]
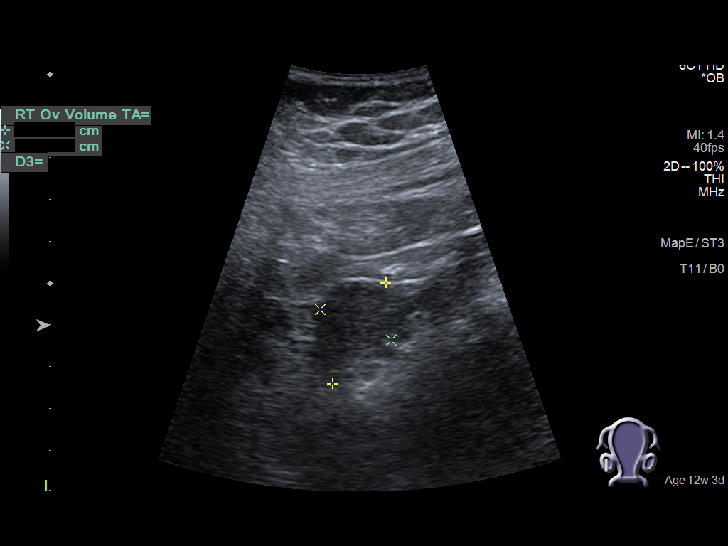
[im 51/60]
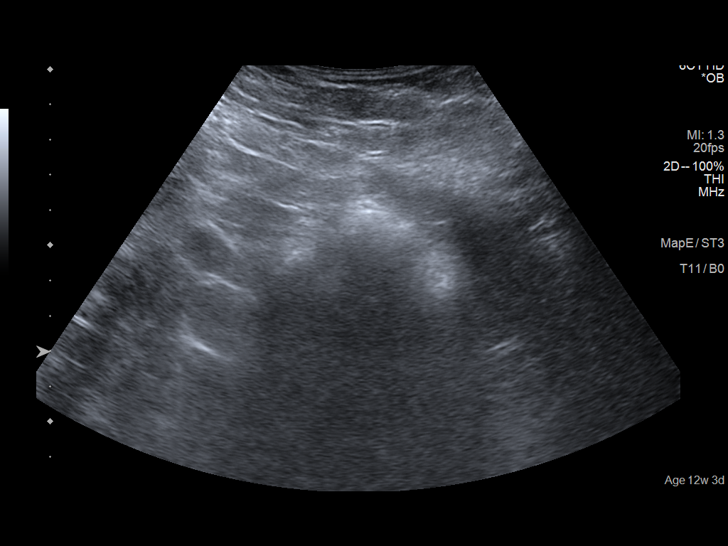
[im 55/60]
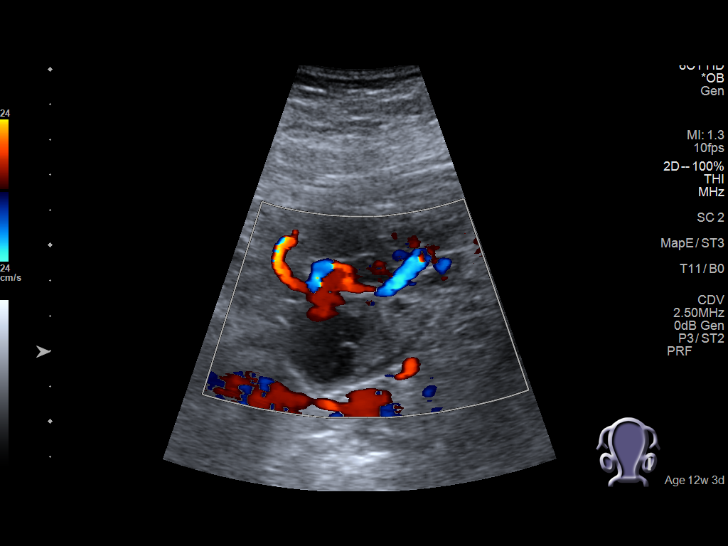
[im 60/60]
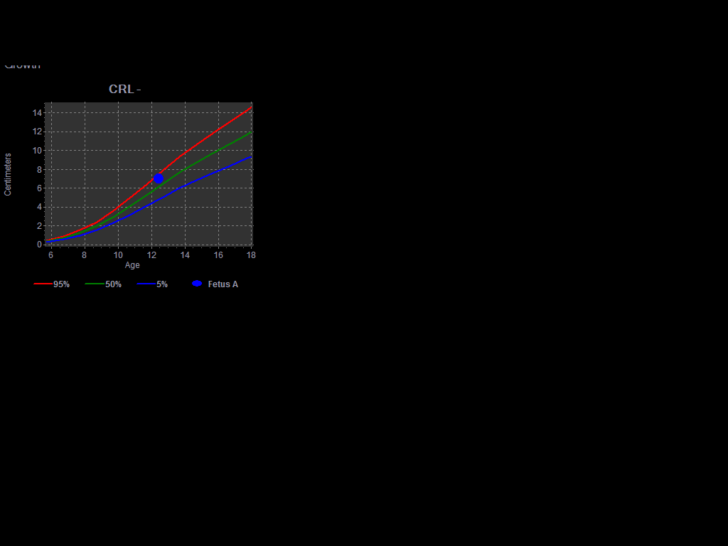

[14 of 28 positions shown; findings below may reference images not displayed]

FINDINGS: Intrauterine gestational sac: Present, single

Yolk sac:  Not identified

Embryo:  Present

Cardiac Activity: Present

Heart Rate: 152 bpm

CRL:   69.9 mm   13 w 1 d                  US EDC: 11/30/2021

Subchorionic hemorrhage:  Small subchronic hemorrhage

Maternal uterus/adnexae:

Small corpus luteum LEFT ovary.

Uterus and ovaries otherwise unremarkable.

No free pelvic fluid or adnexal masses.
IMPRESSION: Single live intrauterine gestation at 13 weeks 1 day EGA by
crown-rump length.

Small subchronic hemorrhage.

## 2023-08-06 ENCOUNTER — Ambulatory Visit: Payer: Self-pay | Admitting: Physician Assistant

## 2023-08-07 DIAGNOSIS — H524 Presbyopia: Secondary | ICD-10-CM | POA: Diagnosis not present

## 2023-08-07 DIAGNOSIS — H43393 Other vitreous opacities, bilateral: Secondary | ICD-10-CM | POA: Diagnosis not present

## 2023-08-07 DIAGNOSIS — H43813 Vitreous degeneration, bilateral: Secondary | ICD-10-CM | POA: Diagnosis not present

## 2023-08-07 DIAGNOSIS — L718 Other rosacea: Secondary | ICD-10-CM | POA: Diagnosis not present

## 2023-08-17 DIAGNOSIS — J019 Acute sinusitis, unspecified: Secondary | ICD-10-CM | POA: Diagnosis not present

## 2023-09-03 ENCOUNTER — Encounter: Payer: Self-pay | Admitting: Pediatrics

## 2023-09-03 ENCOUNTER — Ambulatory Visit: Payer: Federal, State, Local not specified - PPO | Admitting: Pediatrics

## 2023-09-03 VITALS — BP 125/82 | HR 90 | Temp 97.9°F | Ht 66.54 in | Wt 214.0 lb

## 2023-09-03 DIAGNOSIS — L659 Nonscarring hair loss, unspecified: Secondary | ICD-10-CM | POA: Diagnosis not present

## 2023-09-03 DIAGNOSIS — Z1330 Encounter for screening examination for mental health and behavioral disorders, unspecified: Secondary | ICD-10-CM | POA: Diagnosis not present

## 2023-09-03 DIAGNOSIS — L299 Pruritus, unspecified: Secondary | ICD-10-CM

## 2023-09-03 DIAGNOSIS — B9689 Other specified bacterial agents as the cause of diseases classified elsewhere: Secondary | ICD-10-CM

## 2023-09-03 DIAGNOSIS — Z131 Encounter for screening for diabetes mellitus: Secondary | ICD-10-CM

## 2023-09-03 DIAGNOSIS — Z8659 Personal history of other mental and behavioral disorders: Secondary | ICD-10-CM

## 2023-09-03 DIAGNOSIS — H539 Unspecified visual disturbance: Secondary | ICD-10-CM

## 2023-09-03 DIAGNOSIS — Z13220 Encounter for screening for lipoid disorders: Secondary | ICD-10-CM | POA: Diagnosis not present

## 2023-09-03 DIAGNOSIS — J019 Acute sinusitis, unspecified: Secondary | ICD-10-CM

## 2023-09-03 DIAGNOSIS — Z7689 Persons encountering health services in other specified circumstances: Secondary | ICD-10-CM

## 2023-09-03 MED ORDER — DOXYCYCLINE HYCLATE 100 MG PO TABS: 100.0000 mg | ORAL_TABLET | Freq: Two times a day (BID) | ORAL | 0 refills | Status: AC

## 2023-09-03 MED ORDER — HYDROXYZINE HCL 10 MG PO TABS: 5.0000 mg | ORAL_TABLET | Freq: Three times a day (TID) | ORAL | 0 refills | Status: DC | PRN

## 2023-09-03 NOTE — Patient Instructions (Signed)

## 2023-09-03 NOTE — Progress Notes (Signed)
 Establish Care Note  BP 125/82 (BP Location: Right Arm, Patient Position: Sitting, Cuff Size: Normal)   Pulse 90   Temp 97.9 F (36.6 C) (Oral)   Ht 5' 6.54" (1.69 m)   Wt 214 lb (97.1 kg)   LMP 08/27/2023 (Approximate)   SpO2 97%   BMI 33.99 kg/m    Subjective:    Patient ID: Michelle Gordon, adult    DOB: 11/29/1985, 38 y.o.   MRN: [id number]  HPI: Michelle Gordon is a 38 y.o. adult  Chief Complaint  Patient presents with   Establish Care    Patient Previous had a pcp before her office closed    Ear Pain    Patients states she can't hear out of both of her ears.  She has been taking Med   Sinusitis    Patient states she had an sinusitis infection about two weeks ago and she finish her antibiotic a week ago   Facial Swelling    Patient states both of her eyes are in pain. They feel like she should get blood work done  Started the end of November. Tender and it gets really red     Establishing care, the following was discussed today:  Discussed the use of AI scribe software for clinical note transcription with the patient, who gave verbal consent to proceed.  History of Present Illness   Michelle Gordon is a 38 year old female who presents with ear and sinus symptoms.  She has been experiencing ear and sinus symptoms since the start of the month, characterized by significant sinus pressure and ear discomfort. These symptoms worsened after completing a course of azithromycin and prednisone, which initially helped reduce the pressure. The ear symptoms now include a sensation of vibration and a feeling like 'someone stuck a seashell over it.' She has not had similar symptoms since her sinuplasty and septum correction three years ago. There was a recent household flu outbreak during her antibiotic course, but she did not take oseltamivir. Her ears feel overstimulated by noise, with no significant fever, chills, or nausea currently.  For the past few months,  she has been experiencing eye issues, including inflammation, swelling, and pain. Antibiotic drops provided temporary relief. Her vision has deteriorated from perfect to seeing 'like I have opened my oven with black smoke.' She reports new astigmatism and significant sensitivity to light, requiring sunglasses outdoors. Changes in her eyelashes, which have become sparse and blonde, are also noted.  She has a history of migraines, which have improved since her sinus surgery, but she still experiences mild headaches at the end of the day, likely due to eye strain. She reports dryness in her throat and increased thirst. There is no family history of autoimmune conditions, but her mother had a thyroid issue.  She is currently taking sertraline, a multivitamin, and an over-the-counter nasal spray. She has allergies to doxycycline and penicillin, with doxycycline causing itchiness and penicillin causing swelling and hives.  She has four children, with the youngest being 40 months old. She reports sleep disturbances due to her child's sleep patterns and uses a humidifier to manage dry air from gas heating.      Current Outpatient Medications on File Prior to Visit  Medication Sig Dispense Refill   Multiple Vitamins-Minerals (MULTIVITAMIN WITH MINERALS) tablet Take 2 tablets by mouth daily.     sertraline (ZOLOFT) 100 MG tablet Take 1 tablet (100 mg total) by mouth daily. 30 tablet 0   acetaminophen (  TYLENOL) 325 MG tablet Take 2 tablets (650 mg total) by mouth every 4 (four) hours as needed (for pain scale < 4). (Patient not taking: Reported on 09/03/2023)     benzocaine-Menthol (DERMOPLAST) 20-0.5 % AERO Apply 1 application. topically as needed for irritation (perineal discomfort). (Patient not taking: Reported on 09/03/2023)     coconut oil OIL Apply 1 application. topically as needed. (Patient not taking: Reported on 09/03/2023)  0   ibuprofen (ADVIL) 600 MG tablet Take 1 tablet (600 mg total) by mouth every  6 (six) hours. (Patient not taking: Reported on 09/03/2023) 30 tablet 0   Prenatal Vit-Fe Fumarate-FA (PRENATAL MULTIVITAMIN) TABS tablet Take 1 tablet by mouth daily at 12 noon. (Patient not taking: Reported on 09/03/2023)     senna-docusate (SENOKOT-S) 8.6-50 MG tablet Take 2 tablets by mouth daily. (Patient not taking: Reported on 09/03/2023) 30 tablet 0   simethicone (MYLICON) 80 MG chewable tablet Chew 1 tablet (80 mg total) by mouth as needed for flatulence. (Patient not taking: Reported on 09/03/2023) 30 tablet 0   SUMAtriptan (IMITREX) 25 MG tablet TAKE 1 TABLET BY MOUTH EVERY 2 HOURS AS NEEDED FOR MIGRAINE. MAY REPEAT IN 2 HOURS IF HEADACHE PERSISTS OR REOCCURS (Patient not taking: Reported on 09/03/2023) 10 tablet 0   witch hazel-glycerin (TUCKS) pad Apply 1 application. topically as needed for hemorrhoids. (Patient not taking: Reported on 09/03/2023) 40 each 12   No current facility-administered medications on file prior to visit.    #HM Will review HM records and updated as needed.  Relevant past medical, surgical, family and social history reviewed and updated as indicated. Interim medical history since our last visit reviewed. Allergies and medications reviewed and updated.  ROS per HPI unless specifically indicated above     Objective:    BP 125/82 (BP Location: Right Arm, Patient Position: Sitting, Cuff Size: Normal)   Pulse 90   Temp 97.9 F (36.6 C) (Oral)   Ht 5' 6.54" (1.69 m)   Wt 214 lb (97.1 kg)   LMP 08/27/2023 (Approximate)   SpO2 97%   BMI 33.99 kg/m   Wt Readings from Last 3 Encounters:  09/03/23 214 lb (97.1 kg)  11/16/21 242 lb 8.1 oz (110 kg)  05/09/21 207 lb (93.9 kg)     Physical Exam Constitutional:      Appearance: Normal appearance.  HENT:     Head: Normocephalic and atraumatic.     Nose: Congestion present.     Right Turbinates: Enlarged and swollen.     Left Turbinates: Enlarged and swollen.     Right Sinus: Maxillary sinus tenderness and  frontal sinus tenderness present.     Left Sinus: Maxillary sinus tenderness and frontal sinus tenderness present.     Comments: Malar erythema Eyes:     Pupils: Pupils are equal, round, and reactive to light.  Cardiovascular:     Rate and Rhythm: Normal rate and regular rhythm.     Pulses: Normal pulses.     Heart sounds: Normal heart sounds.  Pulmonary:     Effort: Pulmonary effort is normal.     Breath sounds: Normal breath sounds.  Abdominal:     General: Abdomen is flat.     Palpations: Abdomen is soft.  Musculoskeletal:        General: Normal range of motion.     Cervical back: Normal range of motion.  Skin:    General: Skin is warm and dry.     Capillary Refill: Capillary refill  takes less than 2 seconds.  Neurological:     General: No focal deficit present.     Mental Status: She is alert. Mental status is at baseline.  Psychiatric:        Mood and Affect: Mood normal.        Behavior: Behavior normal.         09/03/2023    3:13 PM  Depression screen PHQ 2/9  Decreased Interest 2  Down, Depressed, Hopeless 1  PHQ - 2 Score 3  Altered sleeping 3  Tired, decreased energy 3  Change in appetite 3  Feeling bad or failure about yourself  1  Trouble concentrating 3  Moving slowly or fidgety/restless 1  Suicidal thoughts 0  PHQ-9 Score 17  Difficult doing work/chores Somewhat difficult        09/03/2023    3:15 PM  GAD 7 : Generalized Anxiety Score  Nervous, Anxious, on Edge 2  Control/stop worrying 2  Worry too much - different things 2  Trouble relaxing 2  Restless 2  Easily annoyed or irritable 1  Afraid - awful might happen 0  Total GAD 7 Score 11  Anxiety Difficulty Not difficult at all       Assessment & Plan:  Assessment & Plan   Acute bacterial sinusitis Recent sinus pressure and pain, with history of sinus surgery 3 years ago. Recent Z-Pak and prednisone provided some relief. Noted redness and swelling in the nasal area. Recent onset of  tinnitus described as a vibrating sensation in the ear. No signs of infection on examination, but noted thickening behind the tympanic membrane. Likely related to sinus pressure. Likely bacterial sinusitis causing pressure and discomfort vs chronic allergic sinusitis.  -Prescribe broad-spectrum antibiotic for 7 days. -Consider referral to ENT if symptoms persist. -     Doxycycline Hyclate; Take 1 tablet (100 mg total) by mouth 2 (two) times daily for 7 days.  Dispense: 14 tablet; Refill: 0  Hair thinning Chronic issue. Will start lab w/u as below.  -     Thyroid Panel With TSH -     Comprehensive metabolic panel -     CBC  Vision changes Recent onset of vision changes described as spots and half moons. History of astigmatism. Recent use of antibiotic eye drops provided some relief, but symptoms returned. -Order MRI to assess optic nerve and brain areas related to vision given persistence of symptoms -Refer to ophthalmologist for second opinion. -     MR BRAIN W WO CONTRAST; Future  Itching In case of itching with doxy as previously experienced. -     hydrOXYzine HCl; Take 0.5-1 tablets (5-10 mg total) by mouth 3 (three) times daily as needed for itching.  Dispense: 30 tablet; Refill: 0  Encounter to establish care Reviewed available patient record including history, medications, problem list. HM updated as able. Will review and/or request outside records (if applicable) and will fill remaining HM gaps as needed at follow up visit.  Diabetes mellitus screening -     Hemoglobin A1c  Lipid screening -     Lipid panel  History of depression Difficulty maintaining sleep, possibly related to current health concerns and sinus pressure. -Consider adjusting Zoloft dosage or switching to Lexapro once other health concerns are addressed. -Encourage consistent sleep schedule.  Encounter for behavioral health screening As part of their intake evaluation, the patient was screened for  depression, anxiety.  PHQ9 SCORE 17, GAD7 SCORE 11. Screening results positive for tested conditions. Plan  to address further at follow up visit.   Follow up plan: Return in about 2 weeks (around 09/17/2023) for Physical w pap.  Jackolyn Confer, MD

## 2023-09-04 LAB — CBC
Hematocrit: 35.1 % (ref 34.0–46.6)
Hemoglobin: 11.7 g/dL (ref 11.1–15.9)
MCH: 28.3 pg (ref 26.6–33.0)
MCHC: 33.3 g/dL (ref 31.5–35.7)
MCV: 85 fL (ref 79–97)
Platelets: 396 10*3/uL (ref 150–450)
RBC: 4.14 x10E6/uL (ref 3.77–5.28)
RDW: 14.3 % (ref 11.7–15.4)
WBC: 5.3 10*3/uL (ref 3.4–10.8)

## 2023-09-04 LAB — COMPREHENSIVE METABOLIC PANEL
ALT: 25 [IU]/L (ref 0–32)
AST: 17 [IU]/L (ref 0–40)
Albumin: 4.6 g/dL (ref 3.9–4.9)
Alkaline Phosphatase: 76 [IU]/L (ref 44–121)
BUN/Creatinine Ratio: 20 (ref 9–23)
BUN: 14 mg/dL (ref 6–20)
Bilirubin Total: 0.3 mg/dL (ref 0.0–1.2)
CO2: 23 mmol/L (ref 20–29)
Calcium: 9.9 mg/dL (ref 8.7–10.2)
Chloride: 103 mmol/L (ref 96–106)
Creatinine, Ser: 0.69 mg/dL (ref 0.57–1.00)
Globulin, Total: 2.7 g/dL (ref 1.5–4.5)
Glucose: 78 mg/dL (ref 70–99)
Potassium: 4.4 mmol/L (ref 3.5–5.2)
Sodium: 140 mmol/L (ref 134–144)
Total Protein: 7.3 g/dL (ref 6.0–8.5)
eGFR: 114 mL/min/{1.73_m2} (ref >59)

## 2023-09-04 LAB — THYROID PANEL WITH TSH
Free Thyroxine Index: 1.5 (ref 1.2–4.9)
T3 Uptake Ratio: 24 % (ref 24–39)
T4, Total: 6.3 ug/dL (ref 4.5–12.0)
TSH: 1.06 u[IU]/mL (ref 0.450–4.500)

## 2023-09-04 LAB — LIPID PANEL
Chol/HDL Ratio: 5.5 {ratio} — ABNORMAL HIGH (ref 0.0–4.4)
Cholesterol, Total: 225 mg/dL — ABNORMAL HIGH (ref 100–199)
HDL: 41 mg/dL (ref >39)
LDL Chol Calc (NIH): 124 mg/dL — ABNORMAL HIGH (ref 0–99)
Triglycerides: 339 mg/dL — ABNORMAL HIGH (ref 0–149)
VLDL Cholesterol Cal: 60 mg/dL — ABNORMAL HIGH (ref 5–40)

## 2023-09-04 LAB — HEMOGLOBIN A1C
Est. average glucose Bld gHb Est-mCnc: 126 mg/dL
Hgb A1c MFr Bld: 6 % — ABNORMAL HIGH (ref 4.8–5.6)

## 2023-09-12 ENCOUNTER — Ambulatory Visit
Admission: RE | Admit: 2023-09-12 | Discharge: 2023-09-12 | Disposition: A | Discharge status: Home / Self Care | Payer: Federal, State, Local not specified - PPO | Source: Ambulatory Visit | Attending: Ophthalmology | Admitting: Ophthalmology

## 2023-09-12 ENCOUNTER — Telehealth: Payer: Federal, State, Local not specified - PPO | Admitting: Pediatrics

## 2023-09-12 ENCOUNTER — Other Ambulatory Visit
Admission: RE | Admit: 2023-09-12 | Discharge: 2023-09-12 | Disposition: A | Discharge status: Home / Self Care | Payer: Federal, State, Local not specified - PPO | Source: Ambulatory Visit | Attending: Ophthalmology | Admitting: Ophthalmology

## 2023-09-12 ENCOUNTER — Telehealth: Payer: Self-pay | Admitting: Pediatrics

## 2023-09-12 ENCOUNTER — Other Ambulatory Visit: Payer: Self-pay | Admitting: Ophthalmology

## 2023-09-12 ENCOUNTER — Encounter: Payer: Self-pay | Admitting: Pediatrics

## 2023-09-12 ENCOUNTER — Ambulatory Visit: Payer: Self-pay | Admitting: Pediatrics

## 2023-09-12 DIAGNOSIS — J019 Acute sinusitis, unspecified: Secondary | ICD-10-CM

## 2023-09-12 DIAGNOSIS — H2000 Unspecified acute and subacute iridocyclitis: Secondary | ICD-10-CM

## 2023-09-12 DIAGNOSIS — H209 Unspecified iridocyclitis: Secondary | ICD-10-CM

## 2023-09-12 DIAGNOSIS — Z8659 Personal history of other mental and behavioral disorders: Secondary | ICD-10-CM | POA: Insufficient documentation

## 2023-09-12 DIAGNOSIS — R21 Rash and other nonspecific skin eruption: Secondary | ICD-10-CM | POA: Diagnosis not present

## 2023-09-12 DIAGNOSIS — H539 Unspecified visual disturbance: Secondary | ICD-10-CM | POA: Diagnosis not present

## 2023-09-12 LAB — CBC
HCT: 32.7 % — ABNORMAL LOW (ref 36.0–46.0)
Hemoglobin: 11.3 g/dL — ABNORMAL LOW (ref 12.0–15.0)
MCH: 28.7 pg (ref 26.0–34.0)
MCHC: 34.6 g/dL (ref 30.0–36.0)
MCV: 83 fL (ref 80.0–100.0)
Platelets: 398 10*3/uL (ref 150–400)
RBC: 3.94 MIL/uL (ref 3.87–5.11)
RDW: 14.3 % (ref 11.5–15.5)
WBC: 6.6 10*3/uL (ref 4.0–10.5)
nRBC: 0 % (ref 0.0–0.2)

## 2023-09-12 LAB — SEDIMENTATION RATE: Sed Rate: 35 mm/h — ABNORMAL HIGH (ref 0–20)

## 2023-09-12 NOTE — Patient Instructions (Signed)
 I will place a referral for ENT.   Sinus: oxymetazoline (Afrin) nasal stray: 2 sprays each nostril every 12 hours. Don't use more than 3 days in a row to avoid building a tolerance to it.  Sinus rinse (neti pot) high volume sinus rinse can help open up your sinuses and be helpful, especially if you're having sinus pressure and headaches.   Other:  Umcka (pelargonium sidoides extract) can to shorten cold symptoms (can be hard to find, but [employer] carries it: brand name Umcka ColdCare from [employer]). Works best if you start taking at earliest signs of cold symptoms.  Andrographis paniculata is another herbal remedy with less evidence, but may reduce common cold symptoms in adults.  zinc acetate lozenges >= 80 mg/day reduces duration but not severity of cold symptoms in adults, but it is associated with bad taste and nausea Heated humidified air may reduce cold symptoms, so try using a humidifier - especially in your bedroom at night.  Stay hydrated! Aim to drink at least 2 liters of water daily.

## 2023-09-12 NOTE — Telephone Encounter (Signed)
 Copied from CRM [id number]. Topic: Clinical - Medical Advice >> Sep 12, 2023  8:41 AM Turkey B wrote: Reason for CRM: pt called in has been taking, round 2 of  antibiotics since last week, says doesn't seem to be working, still has stuffiness from sinus infection  Chief Complaint: Sinus Infection on Antibiotic Follow Up Symptoms: Congestion, Sinus Pressure Frequency: Ongoing Pertinent Negatives: Patient denies fever Disposition: [] ED /[] Urgent Care (no appt availability in office) / [x] Appointment(In office/virtual)/ []  [place] [facility]/ [] Home Care/ [] Refused Recommended Disposition /[] [place] [facility]/ []  Follow-up with PCP Additional Notes: AF is being triaged regarding persistent symptoms despite antibiotic treatment with doxycycline. The patient has finished the full course of antibiotic therapy and has also been utilizing OTC methods and a humidifier without relief. Virtual Appointment made with PCP for today.   Reason for Disposition  [1] Taking antibiotic > 7 days AND [2] nasal discharge not improved  Answer Assessment - Initial Assessment Questions 1. ANTIBIOTIC: "What antibiotic are you taking?" "How many times a day?"     Doxycycline  2. ONSET: "When was the antibiotic started?"     Last Tuesday, Finished This Tuesday  3. PAIN: "How bad is the sinus pain?"   (Scale 1-10; mild, moderate or severe)   - MILD (1-3): doesn't interfere with normal activities    - MODERATE (4-7): interferes with normal activities (e.g., work or school) or awakens from sleep   - SEVERE (8-10): excruciating pain and patient unable to do any normal activities        2  4. FEVER: "Do you have a fever?" If Yes, ask: "What is it, how was it measured, and when did it start?"      No  5. SYMPTOMS: "Are there any other symptoms you're concerned about?" If Yes, ask: "When did it start?"     Nasal Congestion, Sinus Pain   6. PREGNANCY: "Is there any chance you are pregnant?" "When was your last  menstrual period?"     No, Beginning of February  Protocols used: Sinus Infection on Antibiotic Follow-up Call-[facility]

## 2023-09-12 NOTE — Telephone Encounter (Signed)
 Copied from CRM [id number]. Topic: Clinical - Medical Advice >> Sep 12, 2023  8:41 AM Turkey B wrote: Reason for CRM: pt called in has been taking, round 2 of  antibiotics since last week, says doesn't seem to be working, still has stuffiness from sinus infection

## 2023-09-12 NOTE — Progress Notes (Signed)
 Telehealth Visit  I connected with  Michelle Gordon on 09/12/23 by a video enabled telemedicine application and verified that I am speaking with the correct person using two identifiers.   I discussed the limitations of evaluation and management by telemedicine. The patient expressed understanding and agreed to proceed.  Subjective:    Patient ID: Michelle Gordon, adult    DOB: January 17, 1986, 38 y.o.   MRN: [id number]  HPI: Michelle Gordon is a 38 y.o. adult  Chief Complaint  Patient presents with   Sinusitis    Patient trying to get more relieve on the sinus pressure     Discussed the use of AI scribe software for clinical note transcription with the patient, who gave verbal consent to proceed.  History of Present Illness   Michelle Gordon is a 38 year old female who presents with visual disturbances and facial pain.  She experiences visual disturbances and facial pain, with inflammation in the right retina. There is concern for an autoimmune etiology due to bilateral erythritis and facial redness. Initial blood work was unremarkable, with further tests pending to explore potential autoimmune causes.  Facial pain is localized at the top of her teeth and the back of the roof of her mouth, described as feeling like a 'hole in it'. Breathing is adequate but not optimal. She uses Sudafed and nasal spray for relief, which helps during the day, but symptoms persist at night despite sleeping upright. She has a history of sinus surgery and avoids Afrin due to potential complications post-surgery.  An ophthalmologist evaluated her and noted that her floaters are due to white blood cells, indicating inflammation.  She typically sees Dr. Willeen Cass at [facility] but has not had any issues since her surgery until now. She mentions a four-week wait time to see an ENT specialist.      Relevant past medical, surgical, family and social history reviewed and updated as indicated.  Interim medical history since our last visit reviewed. Allergies and medications reviewed and updated.  ROS per HPI unless specifically indicated above     Objective:    LMP 08/27/2023 (Approximate)   Wt Readings from Last 3 Encounters:  09/03/23 214 lb (97.1 kg)  11/16/21 242 lb 8.1 oz (110 kg)  05/09/21 207 lb (93.9 kg)     Physical Exam Constitutional:      General: She is not in acute distress.    Appearance: Normal appearance.     Comments: Well demarcated rosy pink cheeks, spared nasolabial folds  Pulmonary:     Effort: No respiratory distress.  Neurological:     General: No focal deficit present.     Mental Status: She is alert. Mental status is at baseline.     LIMITED EXAM GIVEN VIDEO VISIT     Assessment & Plan:  Assessment & Plan   Malar rash Concern for possible systemic autoimmune condition given bilateral uveitis, sinusitis, and facial rash. Labs ordered to evaluate for conditions such as lupus, scleroderma, and sarcoidosis to add to optho AI and rheum work up. Questionable hilar lymph nodes and ?interstitial infiltrates bilaterally. Awaiting final read from radiologist. Consider CT. -Await results of ordered labs (Lyme panel, rheumatoid factor, ANA, HLA B27, acetylcholine receptor binding, RPR, TB, sed rate, CBC). -Follow up with patient to discuss results and plan further management. -     ANA+ENA+DNA/DS+Scl 70+SjoSSA/B -     Anti-Smith antibody -     Antiphospholipid Syndrome Comp -  Sjogrens syndrome-A extractable nuclear antibody  Iritis Inflammation of the right retina. Possible autoimmune etiology. Pending further evaluation and treatment response from eye perspective, but I remain concerned about systemic etiology. -Continue current treatment and monitor response. -Consider referral to a retina specialist if inflammation does not resolve. -     ANA+ENA+DNA/DS+Scl 70+SjoSSA/B -     Anti-Smith antibody -     Antiphospholipid Syndrome Comp -      Sjogrens syndrome-A extractable nuclear antibody  Vision changes Evaluated by optho. Concern for AI cause. Agreed with MRI. Replacing order to expedite. -     MR BRAIN W WO CONTRAST; Future  Subacute sinusitis, unspecified location Ongoing symptoms despite treatment. Pain in the upper teeth and roof of the mouth. History of sinus surgery. -Refer to ENT for further evaluation and management. -Continue current treatment with Sudafed and nasal spray. -     MR BRAIN W WO CONTRAST; Future  Follow up plan: Return if symptoms worsen or fail to improve.  Jackolyn Confer, MD   This visit was completed via video visit through MyChart due to the restrictions of the COVID-19 pandemic. All issues as above were discussed and addressed. Physical exam was done as above through visual confirmation on video through MyChart. If it was felt that the patient should be evaluated in the office, they were directed there. The patient verbally consented to this visit.  Location of the patient:  hospital Location of the provider: work Those involved with this call:  Provider: Modena Nunnery, MD CMA: Alger Memos, CMA Time spent on call:  10 minutes with patient face to face via video conference. More than 50% of this time was spent in counseling and coordination of care. 20 minutes total spent in review of patient's record and preparation of their chart. Total time spent on this encounter: 30 minutes.

## 2023-09-12 NOTE — Telephone Encounter (Signed)
 This encounter was created in error - please disregard.

## 2023-09-13 ENCOUNTER — Other Ambulatory Visit: Payer: Federal, State, Local not specified - PPO

## 2023-09-13 DIAGNOSIS — R21 Rash and other nonspecific skin eruption: Secondary | ICD-10-CM | POA: Diagnosis not present

## 2023-09-13 DIAGNOSIS — H209 Unspecified iridocyclitis: Secondary | ICD-10-CM | POA: Diagnosis not present

## 2023-09-13 LAB — ACETYLCHOLINE RECEPTOR, BINDING: Acety choline binding ab: <0.03 nmol/L (ref 0.00–0.24)

## 2023-09-13 LAB — LYME DISEASE SEROLOGY W/REFLEX: Lyme Total Antibody EIA: NEGATIVE

## 2023-09-13 LAB — ANA: Anti Nuclear Antibody (ANA): NEGATIVE

## 2023-09-13 LAB — RPR: RPR Ser Ql: NONREACTIVE

## 2023-09-14 LAB — RHEUMATOID FACTOR: Rheumatoid fact SerPl-aCnc: 10.9 [IU]/mL (ref <14.0)

## 2023-09-15 LAB — ANGIOTENSIN CONVERTING ENZYME: Angiotensin-Converting Enzyme: 42 U/L (ref 14–82)

## 2023-09-16 ENCOUNTER — Ambulatory Visit
Admission: RE | Admit: 2023-09-16 | Discharge: 2023-09-16 | Disposition: A | Discharge status: Home / Self Care | Payer: Federal, State, Local not specified - PPO | Source: Ambulatory Visit | Attending: Pediatrics | Admitting: Pediatrics

## 2023-09-16 ENCOUNTER — Encounter: Payer: Self-pay | Admitting: Radiology

## 2023-09-16 DIAGNOSIS — R519 Headache, unspecified: Secondary | ICD-10-CM | POA: Diagnosis not present

## 2023-09-16 DIAGNOSIS — H539 Unspecified visual disturbance: Secondary | ICD-10-CM | POA: Insufficient documentation

## 2023-09-16 DIAGNOSIS — J019 Acute sinusitis, unspecified: Secondary | ICD-10-CM | POA: Diagnosis not present

## 2023-09-16 MED ORDER — GADOBUTROL 1 MMOL/ML IV SOLN
9.0000 mL | Freq: Once | INTRAVENOUS | Status: AC | PRN
  Administered 2023-09-16: 9 mL via INTRAVENOUS

## 2023-09-17 LAB — QUANTIFERON-TB GOLD PLUS (RQFGPL)
QuantiFERON Mitogen Value: >10 [IU]/mL
QuantiFERON Nil Value: 0.03 [IU]/mL
QuantiFERON TB1 Ag Value: 0.02 [IU]/mL
QuantiFERON TB2 Ag Value: 0.03 [IU]/mL

## 2023-09-17 LAB — QUANTIFERON-TB GOLD PLUS: QuantiFERON-TB Gold Plus: NEGATIVE

## 2023-09-18 DIAGNOSIS — J3489 Other specified disorders of nose and nasal sinuses: Secondary | ICD-10-CM | POA: Diagnosis not present

## 2023-09-18 DIAGNOSIS — J301 Allergic rhinitis due to pollen: Secondary | ICD-10-CM | POA: Diagnosis not present

## 2023-09-18 DIAGNOSIS — J329 Chronic sinusitis, unspecified: Secondary | ICD-10-CM | POA: Diagnosis not present

## 2023-09-18 DIAGNOSIS — J324 Chronic pansinusitis: Secondary | ICD-10-CM | POA: Diagnosis not present

## 2023-09-18 LAB — HLA-B27 ANTIGEN: HLA-B27: NEGATIVE

## 2023-09-19 DIAGNOSIS — H35351 Cystoid macular degeneration, right eye: Secondary | ICD-10-CM | POA: Diagnosis not present

## 2023-09-19 DIAGNOSIS — E230 Hypopituitarism: Secondary | ICD-10-CM | POA: Diagnosis not present

## 2023-09-19 DIAGNOSIS — H2000 Unspecified acute and subacute iridocyclitis: Secondary | ICD-10-CM | POA: Diagnosis not present

## 2023-09-20 DIAGNOSIS — H2513 Age-related nuclear cataract, bilateral: Secondary | ICD-10-CM | POA: Diagnosis not present

## 2023-09-20 DIAGNOSIS — H2000 Unspecified acute and subacute iridocyclitis: Secondary | ICD-10-CM | POA: Diagnosis not present

## 2023-09-20 DIAGNOSIS — H209 Unspecified iridocyclitis: Secondary | ICD-10-CM | POA: Diagnosis not present

## 2023-09-20 DIAGNOSIS — H35351 Cystoid macular degeneration, right eye: Secondary | ICD-10-CM | POA: Diagnosis not present

## 2023-09-25 ENCOUNTER — Other Ambulatory Visit: Payer: Self-pay | Admitting: Pediatrics

## 2023-09-25 DIAGNOSIS — H209 Unspecified iridocyclitis: Secondary | ICD-10-CM

## 2023-09-25 DIAGNOSIS — G932 Benign intracranial hypertension: Secondary | ICD-10-CM

## 2023-09-25 DIAGNOSIS — R21 Rash and other nonspecific skin eruption: Secondary | ICD-10-CM

## 2023-09-25 DIAGNOSIS — H53453 Other localized visual field defect, bilateral: Secondary | ICD-10-CM

## 2023-09-25 DIAGNOSIS — H539 Unspecified visual disturbance: Secondary | ICD-10-CM

## 2023-09-25 NOTE — Progress Notes (Signed)
 Rheumatology referral placed per ENT and optho recommendations  Jackolyn Confer, MD

## 2023-09-25 NOTE — Progress Notes (Signed)
 Patient with multiple neurologic symptoms, following with ENT and optho for vision changes and persistent sinusitis.   Recent MRI without signs of demyelinating disease but based on symptoms, ENT recommending a neurology evaluation. Placed urgent referral.   Jackolyn Confer, MD

## 2023-10-01 LAB — ANTIPHOSPHOLIPID SYNDROME COMP
APTT: 27.2 s
Anticardiolipin Ab, IgA: <10 [APL'U]
Anticardiolipin Ab, IgG: <10 [GPL'U]
Anticardiolipin Ab, IgM: <10 [MPL'U]
Antiphosphatidylserine IgG: 0 {GPS'U}
Antiphosphatidylserine IgM: 6 {MPS'U}
Antiprothrombin Antibody, IgG: 2 G units
Beta-2 Glycoprotein I, IgA: <10 SAU
Beta-2 Glycoprotein I, IgG: <10 SGU
Beta-2 Glycoprotein I, IgM: <10 SMU
DRVVT Screen Seconds: 43.4 s
Hexagonal Phospholipid Neutral: 5 s
Platelet Neutralization: 2.2 s

## 2023-10-01 LAB — ANA+ENA+DNA/DS+SCL 70+SJOSSA/B
ANA Titer 1: NEGATIVE
ENA RNP Ab: <0.2 AI (ref 0.0–0.9)
ENA SM Ab Ser-aCnc: <0.2 AI (ref 0.0–0.9)
ENA SSA (RO) Ab: <0.2 AI (ref 0.0–0.9)
ENA SSB (LA) Ab: <0.2 AI (ref 0.0–0.9)
Scleroderma (Scl-70) (ENA) Antibody, IgG: <0.2 AI (ref 0.0–0.9)
dsDNA Ab: 1 [IU]/mL (ref 0–9)

## 2023-10-03 ENCOUNTER — Encounter: Payer: Federal, State, Local not specified - PPO | Admitting: Pediatrics

## 2023-10-07 ENCOUNTER — Encounter: Payer: Self-pay | Admitting: Diagnostic Neuroimaging

## 2023-10-07 ENCOUNTER — Ambulatory Visit: Payer: Self-pay | Admitting: Diagnostic Neuroimaging

## 2023-10-07 VITALS — BP 140/91 | HR 83

## 2023-10-07 DIAGNOSIS — H209 Unspecified iridocyclitis: Secondary | ICD-10-CM | POA: Diagnosis not present

## 2023-10-07 DIAGNOSIS — G44209 Tension-type headache, unspecified, not intractable: Secondary | ICD-10-CM | POA: Diagnosis not present

## 2023-10-07 DIAGNOSIS — G43109 Migraine with aura, not intractable, without status migrainosus: Secondary | ICD-10-CM | POA: Diagnosis not present

## 2023-10-07 NOTE — Patient Instructions (Addendum)
  UVEITIS - per ophthalmology  PARTIALLY EMPTY SELLA (MRI incidental finding) - monitor for now  MIGRAINE WITH AURA (in remission) - monitor for now  EXCESSIVE DAYTIME FATIGUE - consider sleep study

## 2023-10-07 NOTE — Progress Notes (Signed)
 [facility]  PATIENT: Michelle Gordon DOB: 25-Jul-1985  REFERRING CLINICIAN: Jackolyn Confer, MD HISTORY FROM: patient  REASON FOR VISIT: new consult   HISTORICAL  CHIEF COMPLAINT:  Chief Complaint  Patient presents with   New Patient (Initial Visit)    Pt alone room 7. Pt states having vision issues and has been to optometrist, last visit 09/19/23. The appt was with retina specialist, DX uveitis but unsure what is triggering the inflammation. Pt states has had a lot of fatigue, driving more than 30 minutes will cause her to fall asleep. States she gets plenty of sleep. States about 1 or 2 in the afternoon gets sleepy and it is hard to get over, usually has to take a nap because unable to recover. States watches tv before bed and then reading but usually    HISTORY OF PRESENT ILLNESS:   38 year old female here for evaluation of vision changes, uveitis, MRI brain findings.  October 2024 patient started having irritation in her eyes, went to eye doctor, then retina specialist, ultimately diagnosed with uveitis and treated with topical medications.  Symptoms slightly improved.  No underlying specific systemic causes been found.  Patient was having some issues with mild general headaches, gray curtain over her visual field that was intermittent now more consistent, blurred vision, and MRI of the brain was obtained.  This showed nonspecific enlarged partially empty sella but otherwise normal finding.  Possibility of IIH was raised and patient referred here for evaluation.  Of note patient does not have any papilledema on ophthalmology/retina exam findings.  No tinnitus.  No transient visual obscuration.  Patient does have a history of migraine headaches previously with throbbing headaches, visual aura, sensitive to light and sound.  Migraines of essentially resolved for the past 2 years.    REVIEW OF SYSTEMS: Full 14 system review of systems performed and negative  with exception of: as per HPI.  ALLERGIES: Allergies  Allergen Reactions   Penicillins Rash    HOME MEDICATIONS: Outpatient Medications Prior to Visit  Medication Sig Dispense Refill   azelastine (ASTELIN) 0.1 % nasal spray Place 1 spray into both nostrils 2 (two) times daily.     Difluprednate 0.05 % EMUL Administer 1 drop to both eyes four (4) times a day.     gentamicin ointment (GARAMYCIN) 0.1 % Apply 1 Application topically daily as needed. Apply to nares PRN for dryness and infection     ketorolac (ACULAR) 0.5 % ophthalmic solution Place 1 drop into both eyes 4 (four) times daily.     meloxicam (MOBIC) 7.5 MG tablet Take 7.5 mg by mouth daily.     Multiple Vitamins-Minerals (MULTIVITAMIN WITH MINERALS) tablet Take 2 tablets by mouth daily.     sertraline (ZOLOFT) 100 MG tablet Take 1 tablet (100 mg total) by mouth daily. 30 tablet 0   sulfamethoxazole-trimethoprim (BACTRIM DS) 800-160 MG tablet Take 1 tablet by mouth 2 (two) times daily.     No facility-administered medications prior to visit.    PAST MEDICAL HISTORY: Past Medical History:  Diagnosis Date   Anemia    Anxiety    Headache    sinus   Motion sickness    car passenger, boats   Palpitations 07/24/2021    PAST SURGICAL HISTORY: Past Surgical History:  Procedure Laterality Date   ETHMOIDECTOMY Bilateral 07/19/2020   Procedure: TOTAL ETHMOIDECTOMY;  Surgeon: Geanie Logan, MD;  Location: [facility];  Service: ENT;  Laterality: Bilateral;   IMAGE GUIDED SINUS  SURGERY N/A 07/19/2020   Procedure: IMAGE GUIDED SINUS SURGERY;  Surgeon: Geanie Logan, MD;  Location: [facility];  Service: ENT;  Laterality: N/A;  placed disk on OR charge nurse desk 12-6 kp 2nd disk here  told Darl Pikes ds   LAPAROSCOPY  04/2010   lost IUD   MAXILLARY ANTROSTOMY Bilateral 07/19/2020   Procedure: MAXILLARY ANTROSTOMY WITH TISSUE REMOVAL;  Surgeon: Geanie Logan, MD;  Location: [facility];  Service: ENT;  Laterality:  Bilateral;   NASAL SEPTOPLASTY W/ TURBINOPLASTY Bilateral 07/19/2020   Procedure: NASAL SEPTOPLASTY WITH SUBMUCOUS RESECTION OF TURBINATES;  Surgeon: Geanie Logan, MD;  Location: [facility];  Service: ENT;  Laterality: Bilateral;    FAMILY HISTORY: Family History  Problem Relation Age of Onset   Hyperlipidemia Father    Hypertension Father    Hyperlipidemia Sister    Hypertension Sister    Cancer Maternal Grandmother        colon   Heart disease Paternal Grandfather     SOCIAL HISTORY: Social History   Socioeconomic History   Marital status: Married    Spouse name: Not on file   Number of children: Not on file   Years of education: Not on file   Highest education level: Not on file  Occupational History   Not on file  Tobacco Use   Smoking status: Former    Current packs/day: 0.00    Types: Cigarettes    Quit date: 2006    Years since quitting: 19.2    Passive exposure: Past   Smokeless tobacco: Never  Vaping Use   Vaping status: Never Used  Substance and Sexual Activity   Alcohol use: Yes    Comment: occas (2-3 drinks/month)   Drug use: No   Sexual activity: Yes    Birth control/protection: Inserts  Other Topics Concern   Not on file  Social History Narrative   Not on file   Social Drivers of Health   Financial Resource Strain: Not on file  Food Insecurity: Not on file  Transportation Needs: Not on file  Physical Activity: Not on file  Stress: Not on file  Social Connections: Not on file  Intimate Partner Violence: Not on file     PHYSICAL EXAM  GENERAL EXAM/CONSTITUTIONAL: Vitals:  Vitals:   10/07/23 1605  BP: (!) 140/91  Pulse: 83   There is no height or weight on file to calculate BMI. Wt Readings from Last 3 Encounters:  09/03/23 214 lb (97.1 kg)  11/16/21 242 lb 8.1 oz (110 kg)  05/09/21 207 lb (93.9 kg)   Patient is in no distress; well developed, nourished and groomed; neck is supple  CARDIOVASCULAR: Examination of carotid  arteries is normal; no carotid bruits Regular rate and rhythm, no murmurs Examination of peripheral vascular system by observation and palpation is normal  EYES: Ophthalmoscopic exam of optic discs and posterior segments is normal; no papilledema or hemorrhages Vision Screening   Right eye Left eye Both eyes  Without correction 20/40 20/30 20/40   With correction       MUSCULOSKELETAL: Gait, strength, tone, movements noted in Neurologic exam below  NEUROLOGIC: MENTAL STATUS:      No data to display         awake, alert, oriented to person, place and time recent and remote memory intact normal attention and concentration language fluent, comprehension intact, naming intact fund of knowledge appropriate  CRANIAL NERVE:  2nd - no papilledema on fundoscopic exam 2nd, 3rd, 4th, 6th -  pupils equal and reactive to light, visual fields full to confrontation, extraocular muscles intact, no nystagmus 5th - facial sensation symmetric 7th - facial strength symmetric 8th - hearing intact 9th - palate elevates symmetrically, uvula midline 11th - shoulder shrug symmetric 12th - tongue protrusion midline  MOTOR:  normal bulk and tone, full strength in the BUE, BLE  SENSORY:  normal and symmetric to light touch, pinprick, temperature, vibration  COORDINATION:  finger-nose-finger, fine finger movements normal  REFLEXES:  deep tendon reflexes present and symmetric  GAIT/STATION:  narrow based gait     DIAGNOSTIC DATA (LABS, IMAGING, TESTING) - I reviewed patient records, labs, notes, testing and imaging myself where available.  Lab Results  Component Value Date   WBC 6.6 09/12/2023   HGB 11.3 (L) 09/12/2023   HCT 32.7 (L) 09/12/2023   MCV 83.0 09/12/2023   PLT 398 09/12/2023      Component Value Date/Time   NA 140 09/03/2023 1552   K 4.4 09/03/2023 1552   CL 103 09/03/2023 1552   CO2 23 09/03/2023 1552   GLUCOSE 78 09/03/2023 1552   GLUCOSE 90 11/15/2021 2320    BUN 14 09/03/2023 1552   CREATININE 0.69 09/03/2023 1552   CALCIUM 9.9 09/03/2023 1552   PROT 7.3 09/03/2023 1552   ALBUMIN 4.6 09/03/2023 1552   AST 17 09/03/2023 1552   ALT 25 09/03/2023 1552   ALKPHOS 76 09/03/2023 1552   BILITOT 0.3 09/03/2023 1552   GFRNONAA >60 11/15/2021 2320   GFRAA 126 04/23/2018 1124   Lab Results  Component Value Date   CHOL 225 (H) 09/03/2023   HDL 41 09/03/2023   LDLCALC 124 (H) 09/03/2023   TRIG 339 (H) 09/03/2023   CHOLHDL 5.5 (H) 09/03/2023   Lab Results  Component Value Date   HGBA1C 6.0 (H) 09/03/2023   No results found for: "VITAMINB12" Lab Results  Component Value Date   TSH 1.060 09/03/2023    09/16/23 MRI brain [I reviewed images myself and agree with interpretation. Partially empty sella likely normal variant, not associated with IIH. Optic nerve sheaths are normal. No papilledema on exam. -VRP]  1.  No evidence of an acute intracranial abnormality. 2. Partially empty sella turcica. This finding can reflect incidental anatomic variation, or alternatively, it can be associated with chronic idiopathic intracranial hypertension (pseudotumor cerebri). 3. Otherwise unremarkable MRI appearance of the brain. 4. Mild mucosal thickening within the bilateral maxillary sinuses.   ASSESSMENT AND PLAN  38 y.o. year old adult here with:   Dx:  1. Uveitis   2. Tension headache   3. Migraine with aura and without status migrainosus, not intractable       PLAN:  UVEITIS - per ophthalmology  PARTIALLY EMPTY SELLA (MRI incidental finding) - monitor for now; no other clinical or imaging findings suspicious for idiopathic intracranial hypertension (pseudotumor cerebri)  MIGRAINE WITH AURA (in remission) - monitor for now; consider topiramate +rizatriptan + ubrelvy in future  EXCESSIVE DAYTIME FATIGUE - consider sleep study  Return for return to PCP.    Suanne Marker, MD 10/07/2023, 4:55 PM Certified in Neurology,  Neurophysiology and Neuroimaging  [facility] [address], [address] [place], [place] [address] [phone]

## 2023-10-09 DIAGNOSIS — H2513 Age-related nuclear cataract, bilateral: Secondary | ICD-10-CM | POA: Diagnosis not present

## 2023-10-09 DIAGNOSIS — H43393 Other vitreous opacities, bilateral: Secondary | ICD-10-CM | POA: Diagnosis not present

## 2023-10-09 DIAGNOSIS — H209 Unspecified iridocyclitis: Secondary | ICD-10-CM | POA: Diagnosis not present

## 2023-10-09 DIAGNOSIS — H35351 Cystoid macular degeneration, right eye: Secondary | ICD-10-CM | POA: Diagnosis not present

## 2023-10-16 ENCOUNTER — Encounter: Payer: Self-pay | Admitting: Pediatrics

## 2023-10-16 ENCOUNTER — Ambulatory Visit (INDEPENDENT_AMBULATORY_CARE_PROVIDER_SITE_OTHER): Admitting: Pediatrics

## 2023-10-16 VITALS — BP 132/87 | HR 71 | Temp 98.0°F | Wt 217.2 lb

## 2023-10-16 DIAGNOSIS — R7303 Prediabetes: Secondary | ICD-10-CM | POA: Diagnosis not present

## 2023-10-16 DIAGNOSIS — R21 Rash and other nonspecific skin eruption: Secondary | ICD-10-CM

## 2023-10-16 DIAGNOSIS — G4719 Other hypersomnia: Secondary | ICD-10-CM | POA: Diagnosis not present

## 2023-10-16 DIAGNOSIS — Z6834 Body mass index (BMI) 34.0-34.9, adult: Secondary | ICD-10-CM

## 2023-10-16 DIAGNOSIS — L309 Dermatitis, unspecified: Secondary | ICD-10-CM | POA: Diagnosis not present

## 2023-10-16 DIAGNOSIS — Z1330 Encounter for screening examination for mental health and behavioral disorders, unspecified: Secondary | ICD-10-CM

## 2023-10-16 DIAGNOSIS — H209 Unspecified iridocyclitis: Secondary | ICD-10-CM | POA: Diagnosis not present

## 2023-10-16 MED ORDER — TRIAMCINOLONE ACETONIDE 0.025 % EX OINT: 1.0000 | TOPICAL_OINTMENT | Freq: Two times a day (BID) | CUTANEOUS | 0 refills | Status: AC

## 2023-10-16 NOTE — Patient Instructions (Addendum)
 Will send steroid cream for eczema  I'll send for sleep study  Weight medications: Injectable medications: GLP1 agonist like ozempic, wegovy, zepbound, mounjaro  Once weekly injections Most common side effects: nausea, diarrhea, abdominal pain, constipation, acid reflux Higher risk for pancreatitis (inflammation of pancreas) - if developed this while on the medication would stop it Contraindications: history of medullary thyroid cancer, MEN2 disorders, pregnancy Depending on which injection, can see between 14-26% body weight loss Oral medications - can do single pill or combined as below Welbutrin/naltrexone SE: headaches, insomnia, increased anxiety 8-10% weight loss Phentermine/topiramate SE: headaches, insomnia, increased anxiety 8-10% weight loss With topiramate: nausea is most common Orlistat SE: greasy stools, abdominal pain 2% weight loss

## 2023-10-16 NOTE — Progress Notes (Unsigned)
   Office Visit  BP 132/87   Pulse 71   Temp 98 F (36.7 C) (Oral)   Wt 217 lb 3.2 oz (98.5 kg)   LMP 09/22/2023 (Approximate)   SpO2 98%   Breastfeeding No   BMI 34.50 kg/m    Subjective:    Patient ID: Michelle Gordon, adult    DOB: [birth year], 38 y.o.   MRN: [id number]  HPI: Michelle Gordon is a 38 y.o. adult  Chief Complaint  Patient presents with  . Rash    Hasn't heard anything from referral printed letter in chart    Discussed the use of AI scribe software for clinical note transcription with the patient, who gave verbal consent to proceed.  History of Present Illness     Relevant past medical, surgical, family and social history reviewed and updated as indicated. Interim medical history since our last visit reviewed. Allergies and medications reviewed and updated.  ROS per HPI unless specifically indicated above     Objective:    BP 132/87   Pulse 71   Temp 98 F (36.7 C) (Oral)   Wt 217 lb 3.2 oz (98.5 kg)   LMP 09/22/2023 (Approximate)   SpO2 98%   Breastfeeding No   BMI 34.50 kg/m   Wt Readings from Last 3 Encounters:  10/16/23 217 lb 3.2 oz (98.5 kg)  09/03/23 214 lb (97.1 kg)  11/16/21 242 lb 8.1 oz (110 kg)     Physical Exam      10/16/2023   11:30 AM 09/03/2023    3:13 PM  Depression screen PHQ 2/9  Decreased Interest 1 2  Down, Depressed, Hopeless 1 1  PHQ - 2 Score 2 3  Altered sleeping 3 3  Tired, decreased energy 3 3  Change in appetite 2 3  Feeling bad or failure about yourself  1 1  Trouble concentrating 1 3  Moving slowly or fidgety/restless 0 1  Suicidal thoughts 0 0  PHQ-9 Score 12 17  Difficult doing work/chores Somewhat difficult Somewhat difficult       10/16/2023   11:30 AM 09/03/2023    3:15 PM  GAD 7 : Generalized Anxiety Score  Nervous, Anxious, on Edge 1 2  Control/stop worrying 1 2  Worry too much - different things 1 2  Trouble relaxing 1 2  Restless 1 2  Easily annoyed or irritable 1 1   Afraid - awful might happen 0 0  Total GAD 7 Score 6 11  Anxiety Difficulty Not difficult at all Not difficult at all       Assessment & Plan:  Assessment & Plan   There are no diagnoses linked to this encounter.   Assessment and Plan Assessment & Plan      Follow up plan: No follow-ups on file.  Michelle Confer, MD

## 2023-10-17 ENCOUNTER — Encounter: Payer: Self-pay | Admitting: Pediatrics

## 2023-10-17 DIAGNOSIS — R7303 Prediabetes: Secondary | ICD-10-CM | POA: Insufficient documentation

## 2023-10-17 DIAGNOSIS — G4719 Other hypersomnia: Secondary | ICD-10-CM | POA: Insufficient documentation

## 2023-10-17 DIAGNOSIS — L309 Dermatitis, unspecified: Secondary | ICD-10-CM | POA: Insufficient documentation

## 2023-10-17 DIAGNOSIS — Z6834 Body mass index (BMI) 34.0-34.9, adult: Secondary | ICD-10-CM | POA: Insufficient documentation

## 2023-10-17 DIAGNOSIS — H209 Unspecified iridocyclitis: Secondary | ICD-10-CM | POA: Insufficient documentation

## 2023-10-17 NOTE — Assessment & Plan Note (Signed)
 Intermittent flare-ups with current patches. Prescribed Kenalog or Triamcinolone cream for flare-ups. - Prescribe Kenalog or Triamcinolone cream for eczema flare-ups. - Instruct to apply cream to affected areas and report if symptoms do not improve.

## 2023-10-17 NOTE — Assessment & Plan Note (Signed)
 Home glucose monitoring shows a maximum reading of 108 mg/dL. Previous A1c levels indicated prediabetes. Current elevated glucose levels likely influenced by steroid use. - Monitor blood glucose levels at home. - Plan to recheck A1c in three months.

## 2023-10-17 NOTE — Assessment & Plan Note (Signed)
 Symptoms suggestive of sleep apnea. Home sleep study warranted to confirm diagnosis. Epworth 19. - Order home sleep study to evaluate for obstructive sleep apnea.

## 2023-10-17 NOTE — Assessment & Plan Note (Signed)
 Concerned about weight gain despite strict diet. Discussed potential weight management medications but advised waiting until current medical issues are resolved. - Provide information on weight management medications, including injectables and oral options. - Revisit weight management options after resolution of current medical issues.

## 2023-10-17 NOTE — Assessment & Plan Note (Signed)
 Persistent symptoms with some improvement post-injections. Etiology unclear; further evaluation by rheumatologist planned for potential autoimmune causes. Current labs inconclusive; additional tests pending. - Refer to rheumatologist for further evaluation of potential autoimmune causes. - Continue follow-up with ophthalmologist in two weeks.

## 2023-10-21 DIAGNOSIS — H35351 Cystoid macular degeneration, right eye: Secondary | ICD-10-CM | POA: Diagnosis not present

## 2023-10-21 DIAGNOSIS — H40053 Ocular hypertension, bilateral: Secondary | ICD-10-CM | POA: Diagnosis not present

## 2023-10-24 DIAGNOSIS — J329 Chronic sinusitis, unspecified: Secondary | ICD-10-CM | POA: Diagnosis not present

## 2023-10-24 DIAGNOSIS — J301 Allergic rhinitis due to pollen: Secondary | ICD-10-CM | POA: Diagnosis not present

## 2023-10-28 DIAGNOSIS — H40053 Ocular hypertension, bilateral: Secondary | ICD-10-CM | POA: Diagnosis not present

## 2023-10-28 DIAGNOSIS — H40003 Preglaucoma, unspecified, bilateral: Secondary | ICD-10-CM | POA: Diagnosis not present

## 2023-11-11 DIAGNOSIS — H40003 Preglaucoma, unspecified, bilateral: Secondary | ICD-10-CM | POA: Diagnosis not present

## 2023-11-28 DIAGNOSIS — M47819 Spondylosis without myelopathy or radiculopathy, site unspecified: Secondary | ICD-10-CM | POA: Diagnosis not present

## 2023-11-28 DIAGNOSIS — H2013 Chronic iridocyclitis, bilateral: Secondary | ICD-10-CM | POA: Diagnosis not present

## 2023-11-28 DIAGNOSIS — Z79899 Other long term (current) drug therapy: Secondary | ICD-10-CM | POA: Diagnosis not present

## 2023-11-28 DIAGNOSIS — M357 Hypermobility syndrome: Secondary | ICD-10-CM | POA: Diagnosis not present

## 2023-12-03 DIAGNOSIS — M47819 Spondylosis without myelopathy or radiculopathy, site unspecified: Secondary | ICD-10-CM | POA: Diagnosis not present

## 2023-12-03 DIAGNOSIS — Z1159 Encounter for screening for other viral diseases: Secondary | ICD-10-CM | POA: Diagnosis not present

## 2023-12-13 ENCOUNTER — Other Ambulatory Visit: Payer: Self-pay | Admitting: Pediatrics

## 2023-12-13 DIAGNOSIS — Z8659 Personal history of other mental and behavioral disorders: Secondary | ICD-10-CM

## 2023-12-13 MED ORDER — SERTRALINE HCL 100 MG PO TABS: 100.0000 mg | ORAL_TABLET | Freq: Every day | ORAL | 3 refills | Status: AC

## 2023-12-13 NOTE — Progress Notes (Signed)
 Requesting zoloft  refills.   Hadassah Letters, MD

## 2024-01-15 DIAGNOSIS — H35351 Cystoid macular degeneration, right eye: Secondary | ICD-10-CM | POA: Diagnosis not present

## 2024-01-15 DIAGNOSIS — H2513 Age-related nuclear cataract, bilateral: Secondary | ICD-10-CM | POA: Diagnosis not present

## 2024-01-15 DIAGNOSIS — H2000 Unspecified acute and subacute iridocyclitis: Secondary | ICD-10-CM | POA: Diagnosis not present

## 2024-03-04 DIAGNOSIS — M47819 Spondylosis without myelopathy or radiculopathy, site unspecified: Secondary | ICD-10-CM | POA: Diagnosis not present

## 2024-03-04 DIAGNOSIS — Z79899 Other long term (current) drug therapy: Secondary | ICD-10-CM | POA: Diagnosis not present

## 2024-03-04 DIAGNOSIS — L4050 Arthropathic psoriasis, unspecified: Secondary | ICD-10-CM | POA: Diagnosis not present

## 2024-03-04 DIAGNOSIS — H2013 Chronic iridocyclitis, bilateral: Secondary | ICD-10-CM | POA: Diagnosis not present

## 2024-04-10 DIAGNOSIS — R202 Paresthesia of skin: Secondary | ICD-10-CM | POA: Diagnosis not present

## 2024-04-13 DIAGNOSIS — M5412 Radiculopathy, cervical region: Secondary | ICD-10-CM | POA: Diagnosis not present

## 2024-04-13 DIAGNOSIS — M9902 Segmental and somatic dysfunction of thoracic region: Secondary | ICD-10-CM | POA: Diagnosis not present

## 2024-04-13 DIAGNOSIS — M9901 Segmental and somatic dysfunction of cervical region: Secondary | ICD-10-CM | POA: Diagnosis not present

## 2024-04-13 DIAGNOSIS — M542 Cervicalgia: Secondary | ICD-10-CM | POA: Diagnosis not present

## 2024-04-14 DIAGNOSIS — M5412 Radiculopathy, cervical region: Secondary | ICD-10-CM | POA: Diagnosis not present

## 2024-04-14 DIAGNOSIS — M542 Cervicalgia: Secondary | ICD-10-CM | POA: Diagnosis not present

## 2024-04-14 DIAGNOSIS — M9902 Segmental and somatic dysfunction of thoracic region: Secondary | ICD-10-CM | POA: Diagnosis not present

## 2024-04-14 DIAGNOSIS — M9901 Segmental and somatic dysfunction of cervical region: Secondary | ICD-10-CM | POA: Diagnosis not present

## 2024-04-15 DIAGNOSIS — M9902 Segmental and somatic dysfunction of thoracic region: Secondary | ICD-10-CM | POA: Diagnosis not present

## 2024-04-15 DIAGNOSIS — M9901 Segmental and somatic dysfunction of cervical region: Secondary | ICD-10-CM | POA: Diagnosis not present

## 2024-04-15 DIAGNOSIS — M5412 Radiculopathy, cervical region: Secondary | ICD-10-CM | POA: Diagnosis not present

## 2024-04-15 DIAGNOSIS — M542 Cervicalgia: Secondary | ICD-10-CM | POA: Diagnosis not present

## 2024-04-20 DIAGNOSIS — M5412 Radiculopathy, cervical region: Secondary | ICD-10-CM | POA: Diagnosis not present

## 2024-04-20 DIAGNOSIS — M9902 Segmental and somatic dysfunction of thoracic region: Secondary | ICD-10-CM | POA: Diagnosis not present

## 2024-04-20 DIAGNOSIS — M542 Cervicalgia: Secondary | ICD-10-CM | POA: Diagnosis not present

## 2024-04-20 DIAGNOSIS — M9901 Segmental and somatic dysfunction of cervical region: Secondary | ICD-10-CM | POA: Diagnosis not present

## 2024-04-22 DIAGNOSIS — M9902 Segmental and somatic dysfunction of thoracic region: Secondary | ICD-10-CM | POA: Diagnosis not present

## 2024-04-22 DIAGNOSIS — M542 Cervicalgia: Secondary | ICD-10-CM | POA: Diagnosis not present

## 2024-04-22 DIAGNOSIS — M9901 Segmental and somatic dysfunction of cervical region: Secondary | ICD-10-CM | POA: Diagnosis not present

## 2024-04-22 DIAGNOSIS — M5412 Radiculopathy, cervical region: Secondary | ICD-10-CM | POA: Diagnosis not present

## 2024-04-24 DIAGNOSIS — S92515A Nondisplaced fracture of proximal phalanx of left lesser toe(s), initial encounter for closed fracture: Secondary | ICD-10-CM | POA: Diagnosis not present

## 2024-04-27 DIAGNOSIS — M9902 Segmental and somatic dysfunction of thoracic region: Secondary | ICD-10-CM | POA: Diagnosis not present

## 2024-04-27 DIAGNOSIS — M542 Cervicalgia: Secondary | ICD-10-CM | POA: Diagnosis not present

## 2024-04-27 DIAGNOSIS — M5412 Radiculopathy, cervical region: Secondary | ICD-10-CM | POA: Diagnosis not present

## 2024-04-27 DIAGNOSIS — M9901 Segmental and somatic dysfunction of cervical region: Secondary | ICD-10-CM | POA: Diagnosis not present

## 2024-04-29 DIAGNOSIS — M5412 Radiculopathy, cervical region: Secondary | ICD-10-CM | POA: Diagnosis not present

## 2024-04-29 DIAGNOSIS — M9901 Segmental and somatic dysfunction of cervical region: Secondary | ICD-10-CM | POA: Diagnosis not present

## 2024-04-29 DIAGNOSIS — M9902 Segmental and somatic dysfunction of thoracic region: Secondary | ICD-10-CM | POA: Diagnosis not present

## 2024-04-29 DIAGNOSIS — M542 Cervicalgia: Secondary | ICD-10-CM | POA: Diagnosis not present

## 2024-05-01 ENCOUNTER — Ambulatory Visit: Payer: Self-pay | Admitting: Pediatrics

## 2024-05-04 DIAGNOSIS — M5412 Radiculopathy, cervical region: Secondary | ICD-10-CM | POA: Diagnosis not present

## 2024-05-04 DIAGNOSIS — M9902 Segmental and somatic dysfunction of thoracic region: Secondary | ICD-10-CM | POA: Diagnosis not present

## 2024-05-04 DIAGNOSIS — M542 Cervicalgia: Secondary | ICD-10-CM | POA: Diagnosis not present

## 2024-05-04 DIAGNOSIS — M9901 Segmental and somatic dysfunction of cervical region: Secondary | ICD-10-CM | POA: Diagnosis not present

## 2024-05-07 DIAGNOSIS — S92515A Nondisplaced fracture of proximal phalanx of left lesser toe(s), initial encounter for closed fracture: Secondary | ICD-10-CM | POA: Diagnosis not present

## 2024-05-30 DIAGNOSIS — R059 Cough, unspecified: Secondary | ICD-10-CM | POA: Diagnosis not present

## 2024-05-30 DIAGNOSIS — J069 Acute upper respiratory infection, unspecified: Secondary | ICD-10-CM | POA: Diagnosis not present

## 2024-05-30 DIAGNOSIS — J3489 Other specified disorders of nose and nasal sinuses: Secondary | ICD-10-CM | POA: Diagnosis not present

## 2024-06-04 DIAGNOSIS — L4050 Arthropathic psoriasis, unspecified: Secondary | ICD-10-CM | POA: Diagnosis not present

## 2024-07-06 ENCOUNTER — Encounter: Payer: Self-pay | Admitting: Nurse Practitioner

## 2024-07-06 ENCOUNTER — Ambulatory Visit: Admitting: Nurse Practitioner

## 2024-07-06 VITALS — BP 128/89 | HR 74 | Temp 97.9°F | Resp 15 | Ht 66.54 in | Wt 220.0 lb

## 2024-07-06 DIAGNOSIS — J0110 Acute frontal sinusitis, unspecified: Secondary | ICD-10-CM | POA: Diagnosis not present

## 2024-07-06 MED ORDER — PREDNISONE 20 MG PO TABS: 40.0000 mg | ORAL_TABLET | Freq: Every day | ORAL | 0 refills | Status: AC

## 2024-07-06 MED ORDER — AZITHROMYCIN 250 MG PO TABS: ORAL_TABLET | ORAL | 0 refills | Status: AC

## 2024-07-06 MED ORDER — BENZONATATE 100 MG PO CAPS: 100.0000 mg | ORAL_CAPSULE | Freq: Two times a day (BID) | ORAL | 0 refills | Status: AC | PRN

## 2024-07-06 NOTE — Assessment & Plan Note (Signed)
 Acute for one week and not improving. Will send in Zpack, Prednisone  40 MG daily for 5 days, and Tessalon  for treatment as going into holiday week. No red flags on exam. Recommend: - Increased rest - Increasing Fluids - Acetaminophen  / ibuprofen  as needed for fever/pain.  - Salt water gargling, chloraseptic spray and throat lozenges  - Mucinex.  - Saline sinus flushes or a neti pot.  - Humidifying the air.

## 2024-07-06 NOTE — Patient Instructions (Signed)

## 2024-07-06 NOTE — Progress Notes (Signed)
 "  BP 128/89 (BP Location: Left Arm, Patient Position: Sitting, Cuff Size: Large)   Pulse 74   Temp 97.9 F (36.6 C) (Oral)   Resp 15   Ht 5' 6.54 (1.69 m)   Wt 220 lb (99.8 kg)   LMP 07/05/2024 (Exact Date)   SpO2 96%   BMI 34.94 kg/m    Subjective:    Patient ID: Michelle Gordon, female    DOB: 15-Nov-1985, 38 y.o.   MRN: [id number]  HPI: Michelle Gordon is a 38 y.o. female  Chief Complaint  Patient presents with   URI    Started Wednesday last week. Stuffy nose, face hurts, eyes hurt, headaches and chest hurts with coughing.    UPPER RESPIRATORY TRACT INFECTION Symptoms ongoing for one week, not improving. Works at the [employer].   Fever: no Cough: yes Shortness of breath: yes Wheezing: yes Chest pain: no Chest tightness: no Chest congestion: no Nasal congestion: yes Runny nose: yes Post nasal drip: yes Sneezing: no Sore throat: no Swollen glands: no Sinus pressure: yes Headache: yes Face pain: yes Toothache: no Ear pain: none Ear pressure: none Eyes red/itching:no Eye drainage/crusting: no  Vomiting: no Rash: no Fatigue: yes Sick contacts: yes Strep contacts: no  Context: fluctuating Recurrent sinusitis: no Relief with OTC cold/cough medications: a little bit with headache  Treatments attempted: Dayquil, Tylenol , and Ibuprofen     Relevant past medical, surgical, family and social history reviewed and updated as indicated. Interim medical history since our last visit reviewed. Allergies and medications reviewed and updated.  Review of Systems  Constitutional:  Positive for fatigue. Negative for activity change, appetite change, diaphoresis and fever.  HENT:  Positive for congestion, postnasal drip, rhinorrhea, sinus pressure and sinus pain. Negative for ear discharge, ear pain, facial swelling, sneezing, sore throat and voice change.   Respiratory:  Positive for cough, shortness of breath and wheezing. Negative for chest tightness.    Cardiovascular:  Negative for chest pain, palpitations and leg swelling.  Gastrointestinal: Negative.   Musculoskeletal:  Negative for myalgias.  Neurological:  Positive for headaches. Negative for dizziness and numbness.  Psychiatric/Behavioral: Negative.     Per HPI unless specifically indicated above     Objective:    BP 128/89 (BP Location: Left Arm, Patient Position: Sitting, Cuff Size: Large)   Pulse 74   Temp 97.9 F (36.6 C) (Oral)   Resp 15   Ht 5' 6.54 (1.69 m)   Wt 220 lb (99.8 kg)   LMP 07/05/2024 (Exact Date)   SpO2 96%   BMI 34.94 kg/m   Wt Readings from Last 3 Encounters:  07/06/24 220 lb (99.8 kg)  10/16/23 217 lb 3.2 oz (98.5 kg)  09/03/23 214 lb (97.1 kg)    Physical Exam Vitals and nursing note reviewed.  Constitutional:      General: She is awake. She is not in acute distress.    Appearance: She is well-developed and well-groomed. She is obese. She is ill-appearing. She is not toxic-appearing.  HENT:     Head: Normocephalic.     Right Ear: Hearing, ear canal and external ear normal. A middle ear effusion is present. Tympanic membrane is not injected or perforated.     Left Ear: Hearing, ear canal and external ear normal. A middle ear effusion is present. Tympanic membrane is not injected or perforated.     Nose: Rhinorrhea present. Rhinorrhea is clear.     Right Sinus: Frontal sinus tenderness present. No maxillary sinus tenderness.  Left Sinus: Frontal sinus tenderness present. No maxillary sinus tenderness.     Mouth/Throat:     Mouth: Mucous membranes are moist.     Pharynx: Posterior oropharyngeal erythema (mild) present. No pharyngeal swelling or oropharyngeal exudate.  Eyes:     General: Lids are normal.        Right eye: No discharge.        Left eye: No discharge.     Conjunctiva/sclera: Conjunctivae normal.     Pupils: Pupils are equal, round, and reactive to light.  Neck:     Thyroid : No thyromegaly.     Vascular: No carotid bruit.   Cardiovascular:     Rate and Rhythm: Normal rate and regular rhythm.     Heart sounds: Normal heart sounds. No murmur heard.    No gallop.  Pulmonary:     Effort: Pulmonary effort is normal. No accessory muscle usage or respiratory distress.     Breath sounds: Wheezing (a few scattered expiratory wheezes noted throughout) present. No decreased breath sounds or rales.  Abdominal:     General: Bowel sounds are normal.     Palpations: Abdomen is soft. There is no hepatomegaly or splenomegaly.  Musculoskeletal:     Cervical back: Normal range of motion and neck supple.     Right lower leg: No edema.     Left lower leg: No edema.  Lymphadenopathy:     Head:     Right side of head: No submental, submandibular, tonsillar, preauricular or posterior auricular adenopathy.     Left side of head: No submental, submandibular, tonsillar, preauricular or posterior auricular adenopathy.     Cervical: No cervical adenopathy.  Skin:    General: Skin is warm and dry.  Neurological:     Mental Status: She is alert and oriented to person, place, and time.  Psychiatric:        Attention and Perception: Attention normal.        Mood and Affect: Mood normal.        Speech: Speech normal.        Behavior: Behavior normal. Behavior is cooperative.        Thought Content: Thought content normal.    Results for orders placed or performed in visit on 09/13/23  Antiphospholipid Syndrome Comp   Collection Time: 09/13/23  4:19 PM  Result Value Ref Range   APTT 27.2 sec   APTT 1:1 NP CANCELED sec   APTT 1:1 [place] CANCELED sec   DRVVT Screen Seconds 43.4 sec   DRVVT Confirm Seconds CANCELED sec   DRVVT Ratio CANCELED ratio   Hexagonal Phospholipid Neutral 5 sec   Platelet Neutralization 2.2 sec   Anticardiolipin Ab, IgG <10 GPL   Anticardiolipin Ab, IgM <10 MPL   Anticardiolipin Ab, IgA <10 APL   Beta-2 Glycoprotein I, IgG <10 SGU   Beta-2 Glycoprotein I, IgM <10 SMU   Beta-2 Glycoprotein I, IgA <10  SAU   Antiphosphatidylserine IgG 0 GPS   Antiphosphatidylserine IgM 6 MPS   Antiprothrombin Antibody, IgG 2 G units   LAC Interpretation Comment   ANA+ENA+DNA/DS+Scl 70+SjoSSA/B   Collection Time: 09/13/23  4:19 PM  Result Value Ref Range   ANA Titer 1 Negative    dsDNA Ab 1 0 - 9 IU/mL   ENA RNP Ab <0.2 0.0 - 0.9 AI   ENA SM Ab Ser-aCnc <0.2 0.0 - 0.9 AI   Scleroderma (Scl-70) (ENA) Antibody, IgG <0.2 0.0 - 0.9 AI   ENA SSA (  RO) Ab <0.2 0.0 - 0.9 AI   ENA SSB (LA) Ab <0.2 0.0 - 0.9 AI      Assessment & Plan:   Problem List Items Addressed This Visit       Respiratory   Acute frontal sinusitis - Primary   Acute for one week and not improving. Will send in Zpack, Prednisone  40 MG daily for 5 days, and Tessalon  for treatment as going into holiday week. No red flags on exam. Recommend: - Increased rest - Increasing Fluids - Acetaminophen  / ibuprofen  as needed for fever/pain.  - Salt water gargling, chloraseptic spray and throat lozenges  - Mucinex.  - Saline sinus flushes or a neti pot.  - Humidifying the air.       Relevant Medications   benzonatate  (TESSALON ) 100 MG capsule   azithromycin  (ZITHROMAX ) 250 MG tablet   predniSONE  (DELTASONE ) 20 MG tablet     Follow up plan: Return if symptoms worsen or fail to improve.      "

## 2024-10-01 ENCOUNTER — Ambulatory Visit: Admit: 2024-10-01 | Admitting: Obstetrics and Gynecology
